# Patient Record
Sex: Male | Born: 1960 | Race: White | Hispanic: No | Marital: Married | Smoking: Current every day smoker
Health system: Southern US, Community
[De-identification: ages and names within clinical notes are randomized; demographics above are authoritative.]

## PROBLEM LIST (undated history)

## (undated) DIAGNOSIS — I10 Essential (primary) hypertension: Secondary | ICD-10-CM

## (undated) DIAGNOSIS — E119 Type 2 diabetes mellitus without complications: Secondary | ICD-10-CM

## (undated) DIAGNOSIS — E7800 Pure hypercholesterolemia, unspecified: Secondary | ICD-10-CM

## (undated) DIAGNOSIS — C3490 Malignant neoplasm of unspecified part of unspecified bronchus or lung: Secondary | ICD-10-CM

## (undated) DIAGNOSIS — K219 Gastro-esophageal reflux disease without esophagitis: Secondary | ICD-10-CM

## (undated) HISTORY — PX: NO PAST SURGERIES: SHX2092

---

## 2004-03-04 ENCOUNTER — Emergency Department (HOSPITAL_COMMUNITY): Admission: EM | Admit: 2004-03-04 | Discharge: 2004-03-04 | Payer: Self-pay | Admitting: Emergency Medicine

## 2009-07-27 ENCOUNTER — Emergency Department (HOSPITAL_COMMUNITY): Admission: EM | Admit: 2009-07-27 | Discharge: 2009-07-27 | Payer: Self-pay | Admitting: Emergency Medicine

## 2013-11-02 ENCOUNTER — Encounter (HOSPITAL_COMMUNITY): Payer: Self-pay | Admitting: Emergency Medicine

## 2013-11-02 ENCOUNTER — Inpatient Hospital Stay (HOSPITAL_COMMUNITY): Payer: Self-pay

## 2013-11-02 ENCOUNTER — Emergency Department (HOSPITAL_COMMUNITY): Payer: Self-pay

## 2013-11-02 ENCOUNTER — Inpatient Hospital Stay (HOSPITAL_COMMUNITY)
Admission: EM | Admit: 2013-11-02 | Discharge: 2013-11-04 | DRG: 683 | Disposition: A | Discharge status: Home / Self Care | Payer: Self-pay | Attending: Internal Medicine | Admitting: Internal Medicine

## 2013-11-02 DIAGNOSIS — F17200 Nicotine dependence, unspecified, uncomplicated: Secondary | ICD-10-CM | POA: Diagnosis present

## 2013-11-02 DIAGNOSIS — E871 Hypo-osmolality and hyponatremia: Secondary | ICD-10-CM

## 2013-11-02 DIAGNOSIS — N179 Acute kidney failure, unspecified: Principal | ICD-10-CM

## 2013-11-02 DIAGNOSIS — M549 Dorsalgia, unspecified: Secondary | ICD-10-CM | POA: Diagnosis present

## 2013-11-02 DIAGNOSIS — I10 Essential (primary) hypertension: Secondary | ICD-10-CM | POA: Diagnosis present

## 2013-11-02 DIAGNOSIS — Z833 Family history of diabetes mellitus: Secondary | ICD-10-CM

## 2013-11-02 DIAGNOSIS — T502X5A Adverse effect of carbonic-anhydrase inhibitors, benzothiadiazides and other diuretics, initial encounter: Secondary | ICD-10-CM | POA: Diagnosis present

## 2013-11-02 DIAGNOSIS — I2699 Other pulmonary embolism without acute cor pulmonale: Secondary | ICD-10-CM

## 2013-11-02 DIAGNOSIS — F1010 Alcohol abuse, uncomplicated: Secondary | ICD-10-CM | POA: Diagnosis present

## 2013-11-02 DIAGNOSIS — T3995XA Adverse effect of unspecified nonopioid analgesic, antipyretic and antirheumatic, initial encounter: Secondary | ICD-10-CM | POA: Diagnosis present

## 2013-11-02 DIAGNOSIS — I959 Hypotension, unspecified: Secondary | ICD-10-CM

## 2013-11-02 DIAGNOSIS — D72829 Elevated white blood cell count, unspecified: Secondary | ICD-10-CM | POA: Diagnosis present

## 2013-11-02 HISTORY — DX: Type 2 diabetes mellitus without complications: E11.9

## 2013-11-02 HISTORY — DX: Essential (primary) hypertension: I10

## 2013-11-02 LAB — I-STAT CHEM 8, ED
BUN: 122 mg/dL — AB (ref 6–23)
CALCIUM ION: 1.22 mmol/L (ref 1.12–1.23)
CREATININE: 4.7 mg/dL — AB (ref 0.50–1.35)
Chloride: 91 mEq/L — ABNORMAL LOW (ref 96–112)
GLUCOSE: 150 mg/dL — AB (ref 70–99)
HEMATOCRIT: 48 % (ref 39.0–52.0)
Hemoglobin: 16.3 g/dL (ref 13.0–17.0)
Potassium: 3.9 mEq/L (ref 3.7–5.3)
SODIUM: 125 meq/L — AB (ref 137–147)
TCO2: 24 mmol/L (ref 0–100)

## 2013-11-02 LAB — D-DIMER, QUANTITATIVE (NOT AT ARMC): D DIMER QUANT: 0.42 ug{FEU}/mL (ref 0.00–0.48)

## 2013-11-02 LAB — URINALYSIS, ROUTINE W REFLEX MICROSCOPIC
BILIRUBIN URINE: NEGATIVE
Glucose, UA: NEGATIVE mg/dL
Hgb urine dipstick: NEGATIVE
KETONES UR: NEGATIVE mg/dL
Leukocytes, UA: NEGATIVE
Nitrite: NEGATIVE
PH: 5.5 (ref 5.0–8.0)
PROTEIN: NEGATIVE mg/dL
Specific Gravity, Urine: 1.02 (ref 1.005–1.030)
Urobilinogen, UA: 0.2 mg/dL (ref 0.0–1.0)

## 2013-11-02 LAB — COMPREHENSIVE METABOLIC PANEL
ALT: 43 U/L (ref 0–53)
AST: 28 U/L (ref 0–37)
Albumin: 3.7 g/dL (ref 3.5–5.2)
Alkaline Phosphatase: 36 U/L — ABNORMAL LOW (ref 39–117)
BUN: 111 mg/dL — AB (ref 6–23)
CALCIUM: 9.9 mg/dL (ref 8.4–10.5)
CO2: 24 mEq/L (ref 19–32)
Chloride: 84 mEq/L — ABNORMAL LOW (ref 96–112)
Creatinine, Ser: 4.65 mg/dL — ABNORMAL HIGH (ref 0.50–1.35)
GFR calc non Af Amer: 13 mL/min — ABNORMAL LOW (ref >90)
GFR, EST AFRICAN AMERICAN: 15 mL/min — AB (ref >90)
GLUCOSE: 158 mg/dL — AB (ref 70–99)
Potassium: 4 mEq/L (ref 3.7–5.3)
SODIUM: 126 meq/L — AB (ref 137–147)
TOTAL PROTEIN: 7.2 g/dL (ref 6.0–8.3)
Total Bilirubin: 0.2 mg/dL — ABNORMAL LOW (ref 0.3–1.2)

## 2013-11-02 LAB — PROTIME-INR
INR: 0.99 (ref 0.00–1.49)
PROTHROMBIN TIME: 12.9 s (ref 11.6–15.2)

## 2013-11-02 LAB — CBC
HCT: 42.7 % (ref 39.0–52.0)
Hemoglobin: 15.5 g/dL (ref 13.0–17.0)
MCH: 37.1 pg — AB (ref 26.0–34.0)
MCHC: 36.3 g/dL — AB (ref 30.0–36.0)
MCV: 102.2 fL — AB (ref 78.0–100.0)
PLATELETS: 287 10*3/uL (ref 150–400)
RBC: 4.18 MIL/uL — ABNORMAL LOW (ref 4.22–5.81)
RDW: 12.5 % (ref 11.5–15.5)
WBC: 13.1 10*3/uL — ABNORMAL HIGH (ref 4.0–10.5)

## 2013-11-02 LAB — MRSA PCR SCREENING: MRSA BY PCR: NEGATIVE

## 2013-11-02 LAB — CREATININE, URINE, RANDOM: Creatinine, Urine: 91.56 mg/dL

## 2013-11-02 LAB — SODIUM, URINE, RANDOM: SODIUM UR: 42 meq/L

## 2013-11-02 LAB — TROPONIN I: Troponin I: <0.3 ng/mL (ref <0.30)

## 2013-11-02 LAB — URIC ACID: Uric Acid, Serum: 12 mg/dL — ABNORMAL HIGH (ref 4.0–7.8)

## 2013-11-02 LAB — APTT: APTT: 29 s (ref 24–37)

## 2013-11-02 MED ORDER — TECHNETIUM TC 99M DIETHYLENETRIAME-PENTAACETIC ACID
40.0000 | Freq: Once | INTRAVENOUS | Status: AC | PRN
  Administered 2013-11-02: 40 via INTRAVENOUS

## 2013-11-02 MED ORDER — ONDANSETRON HCL 4 MG PO TABS: 4.0000 mg | ORAL_TABLET | Freq: Four times a day (QID) | ORAL | Status: DC | PRN

## 2013-11-02 MED ORDER — OXYCODONE HCL 5 MG PO TABS
5.0000 mg | ORAL_TABLET | ORAL | Status: DC | PRN
  Administered 2013-11-02: 5 mg via ORAL
  Filled 2013-11-02: qty 1

## 2013-11-02 MED ORDER — NICOTINE 21 MG/24HR TD PT24
21.0000 mg | MEDICATED_PATCH | Freq: Every day | TRANSDERMAL | Status: DC
  Administered 2013-11-03 – 2013-11-04 (×2): 21 mg via TRANSDERMAL
  Filled 2013-11-02 (×2): qty 1

## 2013-11-02 MED ORDER — MOMETASONE FURO-FORMOTEROL FUM 100-5 MCG/ACT IN AERO
2.0000 | INHALATION_SPRAY | Freq: Two times a day (BID) | RESPIRATORY_TRACT | Status: DC
  Administered 2013-11-03 – 2013-11-04 (×3): 2 via RESPIRATORY_TRACT
  Filled 2013-11-02: qty 8.8

## 2013-11-02 MED ORDER — ACETAMINOPHEN 650 MG RE SUPP: 650.0000 mg | Freq: Four times a day (QID) | RECTAL | Status: DC | PRN

## 2013-11-02 MED ORDER — SODIUM CHLORIDE 0.9 % IV BOLUS (SEPSIS)
1000.0000 mL | Freq: Once | INTRAVENOUS | Status: AC
  Administered 2013-11-02: 1000 mL via INTRAVENOUS

## 2013-11-02 MED ORDER — FLUTICASONE-SALMETEROL 115-21 MCG/ACT IN AERO: 2.0000 | INHALATION_SPRAY | Freq: Two times a day (BID) | RESPIRATORY_TRACT | Status: DC

## 2013-11-02 MED ORDER — SODIUM CHLORIDE 0.9 % IJ SOLN
3.0000 mL | Freq: Two times a day (BID) | INTRAMUSCULAR | Status: DC
  Administered 2013-11-03: 3 mL via INTRAVENOUS

## 2013-11-02 MED ORDER — ACETAMINOPHEN 325 MG PO TABS
650.0000 mg | ORAL_TABLET | Freq: Four times a day (QID) | ORAL | Status: DC | PRN
  Administered 2013-11-02 – 2013-11-03 (×2): 650 mg via ORAL
  Filled 2013-11-02 (×2): qty 2

## 2013-11-02 MED ORDER — HEPARIN (PORCINE) IN NACL 100-0.45 UNIT/ML-% IJ SOLN
12.0000 [IU]/kg/h | INTRAMUSCULAR | Status: DC
  Administered 2013-11-02: 12 [IU]/kg/h via INTRAVENOUS
  Filled 2013-11-02: qty 250

## 2013-11-02 MED ORDER — MOMETASONE FURO-FORMOTEROL FUM 100-5 MCG/ACT IN AERO
INHALATION_SPRAY | RESPIRATORY_TRACT | Status: AC
  Filled 2013-11-02: qty 8.8

## 2013-11-02 MED ORDER — ONDANSETRON HCL 4 MG/2ML IJ SOLN: 4.0000 mg | Freq: Four times a day (QID) | INTRAMUSCULAR | Status: DC | PRN

## 2013-11-02 MED ORDER — HEPARIN BOLUS VIA INFUSION
4000.0000 [IU] | Freq: Once | INTRAVENOUS | Status: AC
  Administered 2013-11-02: 4000 [IU] via INTRAVENOUS

## 2013-11-02 MED ORDER — HEPARIN (PORCINE) IN NACL 100-0.45 UNIT/ML-% IJ SOLN
750.0000 [IU]/h | INTRAMUSCULAR | Status: DC
  Administered 2013-11-02: 750 [IU]/h via INTRAVENOUS

## 2013-11-02 MED ORDER — SODIUM CHLORIDE 0.9 % IV SOLN
INTRAVENOUS | Status: DC
  Administered 2013-11-02 – 2013-11-04 (×5): via INTRAVENOUS

## 2013-11-02 MED ORDER — SODIUM CHLORIDE 0.9 % IV SOLN
INTRAVENOUS | Status: AC
  Administered 2013-11-02: 100 mL/h via INTRAVENOUS

## 2013-11-02 MED ORDER — ONDANSETRON HCL 4 MG/2ML IJ SOLN: 4.0000 mg | Freq: Three times a day (TID) | INTRAMUSCULAR | Status: AC | PRN

## 2013-11-02 MED ORDER — TECHNETIUM TO 99M ALBUMIN AGGREGATED
6.0000 | Freq: Once | INTRAVENOUS | Status: AC | PRN
  Administered 2013-11-02: 6 via INTRAVENOUS

## 2013-11-02 NOTE — ED Provider Notes (Signed)
CSN: [id number]     Arrival date & time 11/02/13  1226 History   This chart was scribed for Dakota Acosta, MD by Dakota Patterson, ED Scribe. This patient was seen in room APA10/APA10 and the patient's care was started at 1:16 PM.   Chief Complaint  Patient presents with  . Hypotension  . Dizziness     The history is provided by the patient. No language interpreter was used.    HPI Comments: Dakota Patterson is a 53 y.o. male who presents to the Emergency Department complaining of persistent hypotension over the past few days. He states that he began treatment for HTN 3 months ago. Baseline prior to the medications was 200/100s. He is on 25 mg Metoprolol and Lisinopril with HTCZ at the same time. He reports that he stopped drinking alcohol 6 days ago after consuming alcohol daily before that. He states that he then developed nausea and emesis shortly after his last alcoholic drink that has since resolved. He reports a gradual onset, gradually worsening lower back pain that radiates into bilateral neck and shoulders and is described as stabbing over the past few days. 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 He reports that he became concerned when he began to have difficulty laying down and sleeping at night due to the pain. He states that the pain is non-exertional, non-positional and not associated with deep breathing. He also reports that he has been feeling lightheaded over the past few days and reports one episode of "swimmyheadedness" today but denies any lightheadedness currently. He states that he has experienced a decreased appetite as well having to "choke down" food. Per wife, pt's BPs have been 78/57, 59/45, 59/43, 75/56, 72/52, 71/48, 63/49, 70/50, 68/54 within the past few days and pt states that he stopped taking the Metoprolol 3 days ago due to the hypotension. He denies any CP, SOB, extremity numbness or  weakness, fevers, chills, nausea, emesis and leg swelling as associated symptoms. He has a decreased kidney function at baseline and denies any changes in urination. Urine has been clear but in small amounts which is usual for him. He is a current everyday smoker  PC[facility] with [facility]  Past Medical History  Diagnosis Date  . Hypertension    History reviewed. No pertinent past surgical history. Family History  Problem Relation Age of Onset  . Diabetes Mother    History  Substance Use Topics  . Smoking status: Current Every Day Smoker -- 1.50 packs/day for 40 years    Types: Cigarettes  . Smokeless tobacco: Never Used  . Alcohol Use: 29.4 oz/week    49 Shots of liquor per week     Comment: Per patient hasn't drink any alcohol in a week    Review of Systems  A complete 10 system review of systems was obtained and all systems are negative except as noted in the HPI and PMH.    Allergies  Review of patient's allergies indicates no known allergies.  Home Medications   Prior to Admission medications   Not on File   Triage Vitals: BP 88/59  Pulse 89  Temp(Src) 97.3 F (36.3 C) (Oral)  Resp 18  Ht 5\' 3"  (1.6 m)  Wt 135 lb (61.236 kg)  BMI 23.92 kg/m2  SpO2 99%  Physical Exam  Nursing note and vitals reviewed. Constitutional: He is oriented to person, place, and time. He  appears well-developed and well-nourished. No distress.  HENT:  Head: Normocephalic and atraumatic.  Eyes: EOM are normal.  Neck: Neck supple. No tracheal deviation present.  Cardiovascular: Normal rate.   Pulmonary/Chest: Effort normal. No respiratory distress.  Abdominal: Soft. He exhibits no distension. There is no tenderness.  Musculoskeletal: Normal range of motion.  Neurological: He is alert and oriented to person, place, and time.  Moves all extremities x4, speech is clear, movements are coordinated, the patient is generally weak.  Skin: Skin is warm and dry.  Psychiatric: He has  a normal mood and affect. His behavior is normal.    ED Course  Procedures (including critical care time)  Medications  heparin bolus via infusion 4,000 Units (not administered)  heparin ADULT infusion 100 units/mL (25000 units/250 mL) (not administered)  sodium chloride 0.9 % bolus 1,000 mL (1,000 mLs Intravenous New Bag/Given 11/02/13 1340)  sodium chloride 0.9 % bolus 1,000 mL (1,000 mLs Intravenous New Bag/Given 11/02/13 1340)    DIAGNOSTIC STUDIES: Oxygen Saturation is 99% on RA, normal by my interpretation.    COORDINATION OF CARE: 1:21 PM-Discussed treatment plan which includes IV fluids, CXR, CBC panel, CMP and UA with pt at bedside and pt agreed to plan.   2:04 PM- Radiologist recommend MRA of the chest due to renal failure. CT has been cancelled.   Labs Review Labs Reviewed  CBC - Abnormal; Notable for the following:    WBC 13.1 (*)    RBC 4.18 (*)    MCV 102.2 (*)    MCH 37.1 (*)    MCHC 36.3 (*)    All other components within normal limits  COMPREHENSIVE METABOLIC PANEL - Abnormal; Notable for the following:    Sodium 126 (*)    Chloride 84 (*)    Glucose, Bld 158 (*)    BUN 111 (*)    Creatinine, Ser 4.65 (*)    Alkaline Phosphatase 36 (*)    Total Bilirubin 0.2 (*)    GFR calc non Af Amer 13 (*)    GFR calc Af Amer 15 (*)    All other components within normal limits  I-STAT CHEM 8, ED - Abnormal; Notable for the following:    Sodium 125 (*)    Chloride 91 (*)    BUN 122 (*)    Creatinine, Ser 4.70 (*)    Glucose, Bld 150 (*)    All other components within normal limits  TROPONIN I  URINALYSIS, ROUTINE W REFLEX MICROSCOPIC  APTT  PROTIME-INR    Imaging Review Mr Jodene Nam Chest Wo Contrast  11/02/2013   CLINICAL DATA:  History of hypertension, now with back pain radiating to the neck. Patient with elevated creatinine and does cannot receive intravenous contrast. Please evaluate for thoracic aortic aneurysm and/or dissection.  EXAM: MRA CHEST WITHOUT CONTRAST   TECHNIQUE: Angiographic images of the chest were obtained using MRA technique without intravenous contrast.  CONTRAST:  None  COMPARISON:  DG CHEST 1V PORT dated 11/02/2013  FINDINGS: Vascular Findings:  The thoracic aorta is of normal caliber with measurements as follows. No definite thoracic aortic dissection or periaortic stranding on this noncontrast examination.  Bovine configuration of the aortic arch is suspected. The branch vessels of the aortic arch widely patent throughout their imaged course.  Normal heart size. No pericardial effusion. Although this examination was not tailored for the evaluation the pulmonary arteries, there are discrete filling defect in the central pulmonary arterial tree to suggest central pulmonary embolism. Normal caliber of  the main pulmonary artery.  -------------------------------------------------------------  Thoracic aortic measurements:  Sinotubular junction  35 mm as measured in greatest oblique coronal dimension.  Proximal ascending aorta  27 mm as measured in greatest oblique axial dimension at the level of the main pulmonary artery and approximately 28 mm in greatest oblique sagittal dimension (image 13, series 12).  Aortic arch aorta  20 mm as measured in greatest oblique sagittal dimension.  Proximal descending thoracic aorta  20 mm as measured in greatest oblique axial dimension at the level of the main pulmonary artery.  Distal descending thoracic aorta  20 mm as measured in greatest oblique axial dimension at the level of the diaphragmatic hiatus.  Review of the MIP images confirms the above findings.  -------------------------------------------------------------  Non-Vascular Findings:  No focal airspace opacities.  No pleural effusion.  No bulky mediastinal or hilar lymphadenopathy on this noncontrast examination.  IMPRESSION: No acute cardiopulmonary disease on this noncontrast examination. Specifically, no evidence of thoracic aortic aneurysm or dissection.    Electronically Signed   By: Sandi Mariscal M.D.   On: 11/02/2013 15:34   Dg Chest Port 1 View  11/02/2013   CLINICAL DATA:  Chest pain.  EXAM: PORTABLE CHEST - 1 VIEW  COMPARISON:  None.  FINDINGS: Lungs are clear. Heart size is normal. No pneumothorax or pleural effusion. No focal bony abnormality.  IMPRESSION: Negative chest.   Electronically Signed   By: Inge Rise M.D.   On: 11/02/2013 14:12     EKG Interpretation   Date/Time:  Thursday Nov 02 2013 12:53:11 EDT Ventricular Rate:  85 PR Interval:  128 QRS Duration: 110 QT Interval:  376 QTC Calculation: 447 R Axis:   86 Text Interpretation:  Sinus rhythm LAE, consider biatrial enlargement Left  ventricular hypertrophy Nonspecific T abnrm, anterolateral leads Abnormal  ekg No old tracing to compare Confirmed by Chisom Aust  MD, [place] ([id number]) on  11/02/2013 2:03:54 PM Also confirmed by Lacinda Axon  MD, [place] ([id number])  on 11/02/2013  2:55:31 PM      MDM   Final diagnoses:  Pulmonary embolism  Hypotension  Hyponatremia  Acute renal failure     Discussed care with doctors office RN - verbal lab results from most recent measurement  1.66 - Cr of 10/20/12 2.2 on 4/17 1.19 In March  Last Na - 129  MRI of the chest shows no obvious aortic dissection I does possibly confirm a central pulmonary embolism.  Will start heparin bolus with drip, consult with the hospitalist, continuing to follow blood pressure and pulse, labs show renal failure and hyponatremia worse today than in the past, definitely will need inpatient admission. Possibly ACE inhibitor and dehydration related. Will also need CT scan or lower extremity Dopplers to confirm presence or absence of pulmonary embolism. The patient has remained persistently mildly hypotensive and is requiring ongoing fluid boluses. The patient is critically ill.  Discussed with the hospitalist who agreed to place the patient in a step down bed.  CRITICAL CARE Performed by: Dakota Patterson Total critical  care time: 59 Critical care time was exclusive of separately billable procedures and treating other patients. Critical care was necessary to treat or prevent imminent or life-threatening deterioration. Critical care was time spent personally by me on the following activities: development of treatment plan with patient and/or surrogate as well as nursing, discussions with consultants, evaluation of patient's response to treatment, examination of patient, obtaining history from patient or surrogate, ordering and performing treatments and interventions, ordering and review  of laboratory studies, ordering and review of radiographic studies, pulse oximetry and re-evaluation of patient's condition.    I personally performed the services described in this documentation, which was scribed in my presence. The recorded information has been reviewed and is accurate.    Dakota Acosta, MD 11/02/13 [phone]

## 2013-11-02 NOTE — ED Notes (Signed)
Patient sent here by [facility].  Patient reports hypotension with dizziness x3-4 days. Per wife blood pressures include 78/57, 59/45, 59/43, 75/56, 72/52, 71/48, 63/49, 70/50, 68/54. Patient reports still taking blood pressure medication despite hypotension. Reports generalized fatigue and lower back pain. Denies any fevers.

## 2013-11-02 NOTE — ED Notes (Signed)
Family reported that [facility] would send some records on Pt. Had not received, so I called and personnel reported that they didn"t know anything about sending paperwork.

## 2013-11-02 NOTE — ED Notes (Signed)
C/ back pain x 3 days that radiates to shoulders. In addition, pt has HTN so wife was checking BP at home and BP was noted to be low 81-59'E systolic and 70-76 diastolic so they contacted his PCP at [facility] and his MD suggested he should come here. Currently, BP 93/58.

## 2013-11-02 NOTE — Care Management Note (Signed)
Patient was noted to not have a PCP listed, but per patient PCP is [place]. Entered this information into computer.

## 2013-11-02 NOTE — H&P (Signed)
[facility] History and Physical  Dakota Patterson XNA:[id number] DOB: 1961-04-28 DOA: 11/02/2013  Referring physician: Dr Sabra Heck PCP: No PCP Per Patient   Chief Complaint:  Mid back pain x 7 days Low BP x 3 days  HPI:  53 y/o male with hx of etoh abuse, tobacco abuse who has been on antihypertensives for past 3 months for uncontrolled blood pressure with readings of 200s/ 100s mmhg was brought to the hospital for episodes of low blood pressure with dizziness for past 3 days. Patient reports that for past 10 days she has been having mid back pain with spasms radiating to the neck. 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 Patient has been on 25 mg of metoprolol along with lisinopril and HCTZ for past 3 months for his blood pressure. He reports taking 600-800 mg of ibuprofen twice daily for up to 2-3 days for the back pain. About one week back  he had vital gastroenteritis with several episodes of nausea and vomiting and watery diarrhea that lasted for 3 days . He was unable to keep anything down at that time . He recovered  from the symptoms but since then has not been able to eat or drink well and also has not been urinating much . He reports taking few more tablets of ibuprofen last 2 days for the back pain.  For past week he has not been urinating much. His wife recorded his blood pressure for past 3 days and has been reading 78/57, 59/45, 59/43, 75/56, 72/52, 71/48, 63/49, 70/50, 68/54 . Patient has been feeling lightheaded with poor appetite for the same duration and has not taken his blood pressure medications since then. He denies any headache, chest pain, shortness of breath, palpitations, numbness of his extremities, weakness, fevers, chills, leg swellings or further nausea, vomiting or diarrhea. Denies any sick contacts. Denies any exposure to organophosphorus or arsenic compounds. Denies recent travel.  Course in the ED  patient was hypotensive to  722/44 mmhg , his heart rate, respiratory rate and O2 sats were normal. He was afebrile. Patient was given 2 L of IV normal saline bolus after which blood pressure improved to 118/70 mmhg. blood work done and showed leukocytosis with WBC of 13.1, normal hemoglobin/hematocrit and 2 kids. His sodium was 126, chloride of 84. His BUN was 111 creatinine 4.65. subsequent blood work showed sodium of 125, BUN of 122 and creatinine 4.7. Blood glucose was 158. Troponin was negative. EKG was unremarkable except for LVH. Chest x-ray was normal. Given hypotension and back pain patient had an MRA of the chest without contrast to rule out for aortic dissection which was negative showed discrete filling defect in the central pulmonary arterial tree suggesting central pulmonary embolism. Patient was started on IV heparin drip and [facility] called for admission to step down.  Review of Systems:  Constitutional: Denies fever, chills, diaphoresis, appetite change and fatigue.  HEENT: Denies photophobia, eye pain, redness, hearing loss, ear pain, congestion, sore throat, rhinorrhea, sneezing, mouth sores, trouble swallowing, , neck stiffness and tinnitus.   neck pain+ Respiratory: Denies SOB, DOE, cough, chest tightness,  and wheezing.   Cardiovascular: Denies chest pain, palpitations and leg swelling.  Gastrointestinal: Denies nausea, vomiting, abdominal pain, diarrhea, constipation, blood in stool and abdominal distention.  Genitourinary: Poor urine output+ Denies dysuria, urgency, frequency, hematuria, flank pain  Endocrine: Denies: hot or cold intolerance,  polyuria, polydipsia. Musculoskeletal: Mid back pain Denies myalgias,joint swelling, arthralgias and gait  problem.  Skin: Denies pallor, rash and wound.  Neurological:  dizziness and lightheadedness, denies seizures, syncope, weakness, , numbness and headaches.  Psychiatric/Behavioral: Denies confusion,    Past Medical History  Diagnosis Date  .  Hypertension    History reviewed. No pertinent past surgical history. Social History:  reports that he has been smoking Cigarettes.  He has a 60 pack-year smoking history. He has never used smokeless tobacco. He reports that he drinks about 29.4 ounces of alcohol per week. He reports that he uses illicit drugs (Marijuana).  No Known Allergies  Family History  Problem Relation Age of Onset  . Diabetes Mother     Prior to Admission medications   Medication Sig Start Date End Date Taking? Authorizing Provider  lisinopril-hydrochlorothiazide (PRINZIDE,ZESTORETIC) 20-12.5 MG per tablet Take 1 tablet by mouth daily.   Yes Historical Provider, MD  Fluticasone-Salmeterol (ADVAIR HFA IN) Inhale 1 puff into the lungs daily as needed (shortness of breath).    Historical Provider, MD  metoprolol tartrate (LOPRESSOR) 25 MG tablet Take 25 mg by mouth daily.    Historical Provider, MD     Physical Exam:  Filed Vitals:   11/02/13 1415 11/02/13 1430 11/02/13 1628 11/02/13 1630  BP:  75/50 101/65 80/60  Pulse:  47 91 90  Temp:      TempSrc:      Resp: 18 11 17 14   Height:      Weight:      SpO2:  73%  97%    Constitutional: Vital signs reviewed.  Middle aged male in no acute distress  HEENT: no pallor, no icterus, dry oral mucosa, no cervical lymphadenopathy Cardiovascular: RRR, S1 normal, S2 normal, no MRG Chest: CTAB, no wheezes, rales, or rhonchi Abdominal: Soft. Non-tender, non-distended, bowel sounds are normal, no masses, organomegaly, or guarding present.  GU: no CVA tenderness Ext: warm, no edema. Tenderness to palpation over mid back mainly the  mid scapular area bilaterally. No calf swelling or tenderness  Neurological: A&O x3, non focal  Labs on Admission:  Basic Metabolic Panel:  Recent Labs Lab 11/02/13 1334 11/02/13 1353  NA 126* 125*  K 4.0 3.9  CL 84* 91*  CO2 24  --   GLUCOSE 158* 150*  BUN 111* 122*  CREATININE 4.65* 4.70*  CALCIUM 9.9  --    Liver Function  Tests:  Recent Labs Lab 11/02/13 1334  AST 28  ALT 43  ALKPHOS 36*  BILITOT 0.2*  PROT 7.2  ALBUMIN 3.7   No results found for this basename: LIPASE, AMYLASE,  in the last 168 hours No results found for this basename: AMMONIA,  in the last 168 hours CBC:  Recent Labs Lab 11/02/13 1334 11/02/13 1353  WBC 13.1*  --   HGB 15.5 16.3  HCT 42.7 48.0  MCV 102.2*  --   PLT 287  --    Cardiac Enzymes:  Recent Labs Lab 11/02/13 1334  TROPONINI <0.30   BNP: No components found with this basename: POCBNP,  CBG: No results found for this basename: GLUCAP,  in the last 168 hours  Radiological Exams on Admission: Mr Jodene Nam Chest Wo Contrast  11/02/2013   CLINICAL DATA:  History of hypertension, now with back pain radiating to the neck. Patient with elevated creatinine and does cannot receive intravenous contrast. Please evaluate for thoracic aortic aneurysm and/or dissection.  EXAM: MRA CHEST WITHOUT CONTRAST  TECHNIQUE: Angiographic images of the chest were obtained using MRA technique without intravenous contrast.  CONTRAST:  None  COMPARISON:  DG CHEST 1V PORT dated 11/02/2013  FINDINGS: Vascular Findings:  The thoracic aorta is of normal caliber with measurements as follows. No definite thoracic aortic dissection or periaortic stranding on this noncontrast examination.  Bovine configuration of the aortic arch is suspected. The branch vessels of the aortic arch widely patent throughout their imaged course.  Normal heart size. No pericardial effusion. Although this examination was not tailored for the evaluation the pulmonary arteries, there are discrete filling defect in the central pulmonary arterial tree to suggest central pulmonary embolism. Normal caliber of the main pulmonary artery.  -------------------------------------------------------------  Thoracic aortic measurements:  Sinotubular junction  35 mm as measured in greatest oblique coronal dimension.  Proximal ascending aorta  27 mm as  measured in greatest oblique axial dimension at the level of the main pulmonary artery and approximately 28 mm in greatest oblique sagittal dimension (image 13, series 12).  Aortic arch aorta  20 mm as measured in greatest oblique sagittal dimension.  Proximal descending thoracic aorta  20 mm as measured in greatest oblique axial dimension at the level of the main pulmonary artery.  Distal descending thoracic aorta  20 mm as measured in greatest oblique axial dimension at the level of the diaphragmatic hiatus.  Review of the MIP images confirms the above findings.  -------------------------------------------------------------  Non-Vascular Findings:  No focal airspace opacities.  No pleural effusion.  No bulky mediastinal or hilar lymphadenopathy on this noncontrast examination.  IMPRESSION: No acute cardiopulmonary disease on this noncontrast examination. Specifically, no evidence of thoracic aortic aneurysm or dissection.   Electronically Signed   By: Sandi Mariscal M.D.   On: 11/02/2013 15:34   Dg Chest Port 1 View  11/02/2013   CLINICAL DATA:  Chest pain.  EXAM: PORTABLE CHEST - 1 VIEW  COMPARISON:  None.  FINDINGS: Lungs are clear. Heart size is normal. No pneumothorax or pleural effusion. No focal bony abnormality.  IMPRESSION: Negative chest.   Electronically Signed   By: Inge Rise M.D.   On: 11/02/2013 14:12    EKG: NSR@85  with LVH, no ST-T changes  Assessment/Plan  Principal Problem:   Hypotension Admit to step down likely a combination of antihypertensives, recent viral gastroenteritis, and poor by mouth intake. -Patient given 2 L IV normal saline bolus in the ED following which his blood pressure has improved to 118/70 mmhg.  I will place him on IV normal saline at 150 cc per hour and monitor. Avoid blood pressure medication at this time.   Active Problems:   ? Pulmonary embolism As suggested on MRI of his chest. Patient denies any shortness of breath or chest pain. Denies any  recent travel or family history of blood clots. He has been started on IV heparin drip. Will check VQ scan to confirm PE and Doppler of his lower extremity to rule out DVT. Check d-dimer.     Acute kidney injury Combination of prerenal with recent gastroenteritis and poor by mouth intake, HCTZ and ACE inhibitor and intake of NSAIDs. Check urine on some and urine lites. UA unremarkable. Monitor strict I/O. we'll hydrate him aggressively with IV normal saline at 150 cc per hour. -Wife reports that he is creatinine about 2 weeks back had worsened to 2.1 when checked as outpatient. -hold blood pressure medications at this time. Check renal ultrasound. Check uric acid level and TSH -Monitor renal function in a.m. with IV fluids  Upper back pain Appears to be musculoskeletal with muscle spasms radiating to the  neck. MRI of the chest was unremarkable. We'll place him on when necessary oxycodone and add Flexeril. Does not have any upper extremity weakness or numbness. If pain persistent may need MRI of the cervical/ thoracic spine.    Hyponatremia Secondary to dehydration and heavy etoh use. Wife reports his sodium is chronically low.  Alcohol abuse Patient reports his last drink to be one week back. Does not have any signs of withdrawal.  Will monitor for now.  Tobacco abuse Smokes 1 and 1/2  PPD . Reports trying Chantix in the past but made him sick. Place him on nicotine patch. Counseled strongly on cessation.  Diet:regular  DVT prophylaxis: IV heparin   Code Status: full code Family Communication: discussed with wife and mother in law  at bedside Disposition Plan: home once stable  Dylann Gallier [facility] Pager [phone]  Total time spent on admission :70 minutes  If 7PM-7AM, please contact night-coverage [email or web address] Password [email or web address] 11/02/2013, 5:08 PM

## 2013-11-03 ENCOUNTER — Inpatient Hospital Stay (HOSPITAL_COMMUNITY): Payer: Self-pay

## 2013-11-03 DIAGNOSIS — F1010 Alcohol abuse, uncomplicated: Secondary | ICD-10-CM

## 2013-11-03 DIAGNOSIS — F17200 Nicotine dependence, unspecified, uncomplicated: Secondary | ICD-10-CM

## 2013-11-03 LAB — OSMOLALITY: Osmolality: 302 mOsm/kg — ABNORMAL HIGH (ref 275–300)

## 2013-11-03 LAB — BASIC METABOLIC PANEL
BUN: 76 mg/dL — ABNORMAL HIGH (ref 6–23)
CHLORIDE: 102 meq/L (ref 96–112)
CO2: 23 meq/L (ref 19–32)
Calcium: 9 mg/dL (ref 8.4–10.5)
Creatinine, Ser: 1.88 mg/dL — ABNORMAL HIGH (ref 0.50–1.35)
GFR calc Af Amer: 45 mL/min — ABNORMAL LOW (ref >90)
GFR, EST NON AFRICAN AMERICAN: 39 mL/min — AB (ref >90)
GLUCOSE: 107 mg/dL — AB (ref 70–99)
POTASSIUM: 4.5 meq/L (ref 3.7–5.3)
Sodium: 135 mEq/L — ABNORMAL LOW (ref 137–147)

## 2013-11-03 LAB — OSMOLALITY, URINE: OSMOLALITY UR: 287 mosm/kg — AB (ref 390–1090)

## 2013-11-03 MED ORDER — VITAMIN B-1 100 MG PO TABS
100.0000 mg | ORAL_TABLET | Freq: Every day | ORAL | Status: DC
  Administered 2013-11-03 – 2013-11-04 (×2): 100 mg via ORAL
  Filled 2013-11-03 (×2): qty 1

## 2013-11-03 MED ORDER — FOLIC ACID 1 MG PO TABS
1.0000 mg | ORAL_TABLET | Freq: Every day | ORAL | Status: DC
  Administered 2013-11-03 – 2013-11-04 (×2): 1 mg via ORAL
  Filled 2013-11-03 (×2): qty 1

## 2013-11-03 MED ORDER — THIAMINE HCL 100 MG/ML IJ SOLN: 100.0000 mg | Freq: Every day | INTRAMUSCULAR | Status: DC

## 2013-11-03 MED ORDER — CYCLOBENZAPRINE HCL 10 MG PO TABS
5.0000 mg | ORAL_TABLET | Freq: Two times a day (BID) | ORAL | Status: DC | PRN
  Administered 2013-11-03: 5 mg via ORAL
  Filled 2013-11-03: qty 1

## 2013-11-03 MED ORDER — ADULT MULTIVITAMIN W/MINERALS CH
1.0000 | ORAL_TABLET | Freq: Every day | ORAL | Status: DC
  Administered 2013-11-03 – 2013-11-04 (×2): 1 via ORAL
  Filled 2013-11-03 (×2): qty 1

## 2013-11-03 MED ORDER — LORAZEPAM 1 MG PO TABS: 1.0000 mg | ORAL_TABLET | Freq: Four times a day (QID) | ORAL | Status: DC | PRN

## 2013-11-03 MED ORDER — LORAZEPAM 2 MG/ML IJ SOLN
1.0000 mg | Freq: Four times a day (QID) | INTRAMUSCULAR | Status: DC | PRN
Start: 2013-11-03 — End: 2013-11-04

## 2013-11-03 MED ORDER — HEPARIN SODIUM (PORCINE) 5000 UNIT/ML IJ SOLN
5000.0000 [IU] | Freq: Three times a day (TID) | INTRAMUSCULAR | Status: DC
  Administered 2013-11-03 – 2013-11-04 (×3): 5000 [IU] via SUBCUTANEOUS
  Filled 2013-11-03 (×3): qty 1

## 2013-11-03 NOTE — Progress Notes (Signed)
PT TRANSFERRING TO ROOM 340. PT ALERT AND ORIENTED. NO C/O PAIN THIS SHIFT. LT FOREARM IV INFUSING W/O DIFFICULTY. HR 78 IN NSR. BP STABLE. TRANSFER REPORT CALLED TO MATTHEW RN ON 300.

## 2013-11-03 NOTE — Care Management Note (Signed)
    Page 1 of 1   11/03/2013     3:15:15 PM CARE MANAGEMENT NOTE 11/03/2013  Patient:  Dakota Patterson, Dakota Patterson   Account Number:  [id number]  Date Initiated:  11/03/2013  Documentation initiated by:  Theophilus Kinds  Subjective/Objective Assessment:   Pt admitted from home with hypotension. Pt lives with his wife and is very independent. Pt will return home at discharge.     Action/Plan:   Pt has PCP at [facility]. No other CM needs noted.   Anticipated DC Date:  11/05/2013   Anticipated DC Plan:  [place]  CM consult      Choice offered to / List presented to:             Status of service:  Completed, signed off Medicare Important Message given?   (If response is "NO", the following Medicare IM given date fields will be blank) Date Medicare IM given:   Date Additional Medicare IM given:    Discharge Disposition:  HOME/SELF CARE  Per UR Regulation:    If discussed at Long Length of Stay Meetings, dates discussed:    Comments:  11/03/13 [place], RN BSN CM

## 2013-11-03 NOTE — Progress Notes (Signed)
[facility] PROGRESS NOTE  Dakota Patterson MVH:[id number] DOB: May 17, 1961 DOA: 11/02/2013 PCP: No PCP Per Patient  Assessment/Plan: 1-Hypotension: due to dehydration and continue use of antihypertensive agents. -responded to IVF's resuscitation and stopping antihypertensive drugs -will continue holding BP meds for now -adjust IVF's rate -encourage patient to increase PO intake  2-Acute renal failure: multifactorial: secondary to hypotension, NSAIDS and conitnue use of ACE/HCTZ -continue IVF's -UA WNL and no suggesting UTI -continue holding HCTZ/ACE and NSAID's -follow renal function trend -Cr down to 1.88 today  3-tobacco abuse/alcohol abuse: cessation counseling provided.  -will start CIWA -continue nicotine patch.  4-HTN: stable w/o meds currently -will restart if needed adjusted dose of metoprolol -low sodium diet and close follow up with PCP  5-hyponatremia and elevated MCV: most likely related to alcohol. Also low sodium due to dehydration and use of HCTZ. -will check B12 level and folate  -follow electrolytes trend -continue IVF's -encourage to quit drinking  6-back pain: appears to be muscular in nature  -continue use of PRN oxycodone and flexeril  DVT: heparin   Code Status: Full Family Communication: wife at bedside Disposition Plan: transfer to med-surg bed; home in am if continue stable.   Consultants:  None   Procedures:  See below for x-ray reports   Antibiotics:  None   HPI/Subjective: NAD, afebrile. Patient complaining of mild back pain. Otherwise no complaints.  Objective: Filed Vitals:   11/03/13 0900  BP: 123/72  Pulse: 94  Temp:   Resp: 19    Intake/Output Summary (Last 24 hours) at 11/03/13 1028 Last data filed at 11/03/13 0900  Gross per 24 hour  Intake 2115.83 ml  Output   2775 ml  Net -659.17 ml   Filed Weights   11/02/13 1242 11/02/13 1704 11/03/13 0500  Weight: 61.236 kg (135 lb) 50.9 kg (112 lb 3.4 oz) 50.9 kg  (112 lb 3.4 oz)    Exam:   General:  NAD, afebrile, feeling better; complaining of just mild back pain  Cardiovascular: S1 and S2,2 no rub so rgallops  Respiratory: CTA bilaterally  Abdomen: soft, NT, ND, positive BS  Musculoskeletal: slight mid/low back pain, no numbness, no decrease strength on his extremities.   Data Reviewed: Basic Metabolic Panel:  Recent Labs Lab 11/02/13 1334 11/02/13 1353 11/03/13 0809  NA 126* 125* 135*  K 4.0 3.9 4.5  CL 84* 91* 102  CO2 24  --  23  GLUCOSE 158* 150* 107*  BUN 111* 122* 76*  CREATININE 4.65* 4.70* 1.88*  CALCIUM 9.9  --  9.0   Liver Function Tests:  Recent Labs Lab 11/02/13 1334  AST 28  ALT 43  ALKPHOS 36*  BILITOT 0.2*  PROT 7.2  ALBUMIN 3.7   CBC:  Recent Labs Lab 11/02/13 1334 11/02/13 1353  WBC 13.1*  --   HGB 15.5 16.3  HCT 42.7 48.0  MCV 102.2*  --   PLT 287  --    Cardiac Enzymes:  Recent Labs Lab 11/02/13 1334  TROPONINI <0.30    Recent Results (from the past 240 hour(s))  MRSA PCR SCREENING     Status: None   Collection Time    11/02/13  5:34 PM      Result Value Ref Range Status   MRSA by PCR NEGATIVE  NEGATIVE Final   Comment:            The GeneXpert MRSA Assay (FDA     approved for NASAL specimens     only), is one  component of a     comprehensive MRSA colonization     surveillance program. It is not     intended to diagnose MRSA     infection nor to guide or     monitor treatment for     MRSA infections.     Studies: Mr Jodene Nam Chest Wo Contrast  11/02/2013   CLINICAL DATA:  History of hypertension, now with back pain radiating to the neck. Patient with elevated creatinine and does cannot receive intravenous contrast. Please evaluate for thoracic aortic aneurysm and/or dissection.  EXAM: MRA CHEST WITHOUT CONTRAST  TECHNIQUE: Angiographic images of the chest were obtained using MRA technique without intravenous contrast.  CONTRAST:  None  COMPARISON:  DG CHEST 1V PORT dated  11/02/2013  FINDINGS: Vascular Findings:  The thoracic aorta is of normal caliber with measurements as follows. No definite thoracic aortic dissection or periaortic stranding on this noncontrast examination.  Bovine configuration of the aortic arch is suspected. The branch vessels of the aortic arch widely patent throughout their imaged course.  Normal heart size. No pericardial effusion. Although this examination was not tailored for the evaluation the pulmonary arteries, there are discrete filling defect in the central pulmonary arterial tree to suggest central pulmonary embolism. Normal caliber of the main pulmonary artery.  -------------------------------------------------------------  Thoracic aortic measurements:  Sinotubular junction  35 mm as measured in greatest oblique coronal dimension.  Proximal ascending aorta  27 mm as measured in greatest oblique axial dimension at the level of the main pulmonary artery and approximately 28 mm in greatest oblique sagittal dimension (image 13, series 12).  Aortic arch aorta  20 mm as measured in greatest oblique sagittal dimension.  Proximal descending thoracic aorta  20 mm as measured in greatest oblique axial dimension at the level of the main pulmonary artery.  Distal descending thoracic aorta  20 mm as measured in greatest oblique axial dimension at the level of the diaphragmatic hiatus.  Review of the MIP images confirms the above findings.  -------------------------------------------------------------  Non-Vascular Findings:  No focal airspace opacities.  No pleural effusion.  No bulky mediastinal or hilar lymphadenopathy on this noncontrast examination.  IMPRESSION: No acute cardiopulmonary disease on this noncontrast examination. Specifically, no evidence of thoracic aortic aneurysm or dissection.   Electronically Signed   By: Sandi Mariscal M.D.   On: 11/02/2013 15:34   US Renal  11/03/2013   CLINICAL DATA:  Hypertension and diabetes.  Acute tubular necrosis   EXAM: RENAL/URINARY TRACT ULTRASOUND COMPLETE  COMPARISON:  None.  FINDINGS: Right Kidney:  Length: 10.2 cm. Echogenicity within normal limits. No mass or hydronephrosis visualized.  Left Kidney:  Length: 10.2 cm. Echogenicity within normal limits. No mass or hydronephrosis visualized. Echogenic structure within the inferior pole of the left kidney may represent a small stone.  Bladder:  Appears normal for degree of bladder distention.  IMPRESSION: 1. Normal exam.  No hydronephrosis.   Electronically Signed   By: Kerby Moors M.D.   On: 11/03/2013 09:02   Nm Pulmonary Perf And Vent  11/02/2013   CLINICAL DATA:  Rule out pulmonary embolus.  EXAM: NUCLEAR MEDICINE VENTILATION - PERFUSION LUNG SCAN  TECHNIQUE: Ventilation images were obtained in multiple projections using inhaled aerosol technetium 99 M DTPA. Perfusion images were obtained in multiple projections after intravenous injection of Tc-13m MAA.  RADIOPHARMACEUTICALS:  40 mCi Tc-40m DTPA aerosol and 6.0 mCi Tc-2m MAA  COMPARISON:  Chest x-ray 11/02/2013  FINDINGS: Ventilation: There is minimal hypoventilation at the  posterior lung bases.  Perfusion: There is minimal hypoperfusion at the posterior lung bases, matched with the ventilation defect. There is no associated chest x-ray finding. Findings are consistent with minimal atelectasis. No suspicious wedge-shaped pleural-based defects are identified.  IMPRESSION: Low probability for clinically significant pulmonary embolus.   Electronically Signed   By: Shon Hale M.D.   On: 11/02/2013 21:01   US Venous Img Lower Bilateral  11/02/2013   CLINICAL DATA:  Pulmonary embolism.  EXAM: BILATERAL LOWER EXTREMITY VENOUS DOPPLER ULTRASOUND  TECHNIQUE: Gray-scale sonography with graded compression, as well as color Doppler and duplex ultrasound were performed to evaluate the lower extremity deep venous systems from the level of the common femoral vein and including the common femoral, femoral, profunda femoral,  popliteal and calf veins including the posterior tibial, peroneal and gastrocnemius veins when visible. The superficial great saphenous vein was also interrogated. Spectral Doppler was utilized to evaluate flow at rest and with distal augmentation maneuvers in the common femoral, femoral and popliteal veins.  COMPARISON:  None.  FINDINGS: RIGHT LOWER EXTREMITY  Common Femoral Vein: No evidence of thrombus. Normal compressibility, respiratory phasicity and response to augmentation.  Saphenofemoral Junction: No evidence of thrombus. Normal compressibility and flow on color Doppler imaging.  Profunda Femoral Vein: No evidence of thrombus. Normal compressibility and flow on color Doppler imaging.  Femoral Vein: No evidence of thrombus. Normal compressibility, respiratory phasicity and response to augmentation.  Popliteal Vein: No evidence of thrombus. Normal compressibility, respiratory phasicity and response to augmentation.  Calf Veins: No evidence of thrombus. Normal compressibility and flow on color Doppler imaging.  Superficial Great Saphenous Vein: No evidence of thrombus. Normal compressibility and flow on color Doppler imaging.  Venous Reflux:  None.  Other Findings:  None.  LEFT LOWER EXTREMITY  Common Femoral Vein: No evidence of thrombus. Normal compressibility, respiratory phasicity and response to augmentation.  Saphenofemoral Junction: No evidence of thrombus. Normal compressibility and flow on color Doppler imaging.  Profunda Femoral Vein: No evidence of thrombus. Normal compressibility and flow on color Doppler imaging.  Femoral Vein: No evidence of thrombus. Normal compressibility, respiratory phasicity and response to augmentation.  Popliteal Vein: No evidence of thrombus. Normal compressibility, respiratory phasicity and response to augmentation.  Calf Veins: No evidence of thrombus. Normal compressibility and flow on color Doppler imaging.  Superficial Great Saphenous Vein: No evidence of thrombus.  Normal compressibility and flow on color Doppler imaging.  Venous Reflux:  None.  Other Findings:  None.  IMPRESSION: Negative venous Doppler examination for deep venous thrombosis in bilateral lower extremities.   Electronically Signed   By: Kalman Jewels M.D.   On: 11/02/2013 18:14   Dg Chest Port 1 View  11/02/2013   CLINICAL DATA:  Chest pain.  EXAM: PORTABLE CHEST - 1 VIEW  COMPARISON:  None.  FINDINGS: Lungs are clear. Heart size is normal. No pneumothorax or pleural effusion. No focal bony abnormality.  IMPRESSION: Negative chest.   Electronically Signed   By: Inge Rise M.D.   On: 11/02/2013 14:12    Scheduled Meds: . heparin subcutaneous  5,000 Units Subcutaneous 3 times per day  . mometasone-formoterol  2 puff Inhalation BID  . nicotine  21 mg Transdermal Daily  . sodium chloride  3 mL Intravenous Q12H   Continuous Infusions: . sodium chloride 150 mL/hr at 11/03/13 0900    Principal Problem:   Acute kidney injury Active Problems:   Pulmonary embolism   Hypotension   Hyponatremia   Tobacco use  disorder   ETOH abuse    Time spent: >30 minutes    [facility] Pager [phone]. If 7PM-7AM, please contact night-coverage at [email or web address], password [facility] 11/03/2013, 10:28 AM  LOS: 1 day

## 2013-11-03 NOTE — Progress Notes (Signed)
UR chart review completed.  

## 2013-11-04 DIAGNOSIS — I10 Essential (primary) hypertension: Secondary | ICD-10-CM

## 2013-11-04 LAB — CBC
HEMATOCRIT: 36.6 % — AB (ref 39.0–52.0)
HEMOGLOBIN: 12.9 g/dL — AB (ref 13.0–17.0)
MCH: 36.9 pg — ABNORMAL HIGH (ref 26.0–34.0)
MCHC: 35.2 g/dL (ref 30.0–36.0)
MCV: 104.6 fL — ABNORMAL HIGH (ref 78.0–100.0)
Platelets: 312 10*3/uL (ref 150–400)
RBC: 3.5 MIL/uL — ABNORMAL LOW (ref 4.22–5.81)
RDW: 12.7 % (ref 11.5–15.5)
WBC: 10 10*3/uL (ref 4.0–10.5)

## 2013-11-04 LAB — BASIC METABOLIC PANEL
BUN: 35 mg/dL — AB (ref 6–23)
CHLORIDE: 106 meq/L (ref 96–112)
CO2: 23 mEq/L (ref 19–32)
Calcium: 8.8 mg/dL (ref 8.4–10.5)
Creatinine, Ser: 1.04 mg/dL (ref 0.50–1.35)
GFR calc non Af Amer: 80 mL/min — ABNORMAL LOW (ref >90)
Glucose, Bld: 102 mg/dL — ABNORMAL HIGH (ref 70–99)
POTASSIUM: 4.4 meq/L (ref 3.7–5.3)
Sodium: 139 mEq/L (ref 137–147)

## 2013-11-04 LAB — VITAMIN B12: Vitamin B-12: 251 pg/mL (ref 211–911)

## 2013-11-04 LAB — TSH: TSH: 0.567 u[IU]/mL (ref 0.350–4.500)

## 2013-11-04 MED ORDER — CYCLOBENZAPRINE HCL 5 MG PO TABS: 5.0000 mg | ORAL_TABLET | Freq: Two times a day (BID) | ORAL | Status: DC | PRN

## 2013-11-04 MED ORDER — METOPROLOL TARTRATE 12.5 MG HALF TABLET: 12.5000 mg | ORAL_TABLET | Freq: Two times a day (BID) | ORAL | Status: DC

## 2013-11-04 MED ORDER — NICOTINE 21 MG/24HR TD PT24: 21.0000 mg | MEDICATED_PATCH | Freq: Every day | TRANSDERMAL | Status: DC

## 2013-11-04 MED ORDER — FLUTICASONE-SALMETEROL 115-21 MCG/ACT IN AERO: 2.0000 | INHALATION_SPRAY | Freq: Two times a day (BID) | RESPIRATORY_TRACT | Status: DC

## 2013-11-04 MED ORDER — ADULT MULTIVITAMIN W/MINERALS CH: 1.0000 | ORAL_TABLET | Freq: Every day | ORAL | Status: DC

## 2013-11-04 NOTE — Progress Notes (Signed)
Pt discharged home today per Dr. Dyann Kief. Pt's IV site D/C'd and WNL. Pt's VSS. Pt provided with home medication list, discharge instructions and prescriptions. Verbalized understanding. Pt ambulated off floor in stable condition accompanied by nursing student, Cesalea.

## 2013-11-04 NOTE — Discharge Summary (Signed)
Physician Discharge Summary  Dakota Patterson HKV:[id number] DOB: 1960-08-17 DOA: 11/02/2013  PCP: Blyn date: 11/02/2013 Discharge date: 11/04/2013  Time spent: >30 minutes  Recommendations for Outpatient Follow-up:  1. Reassess BP and adjust medications as needed 2. Support and follow progress of smoking cessation 3. BMET to follow electrolytes and renal function   Discharge Diagnoses:  Principal Problem:   Acute kidney injury Active Problems:   Hypotension   Hyponatremia   Tobacco use disorder   ETOH abuse   Discharge Condition: stable and improved. Will discharge home with PCP follow up in 10 days.  Diet recommendation: low sodium diet  Filed Weights   11/02/13 1242 11/02/13 1704 11/03/13 0500  Weight: 61.236 kg (135 lb) 50.9 kg (112 lb 3.4 oz) 50.9 kg (112 lb 3.4 oz)    History of present illness:  53 y/o male with hx of etoh abuse, tobacco abuse who has been on antihypertensives for past 3 months for uncontrolled blood pressure with readings of 200s/ 100s mmhg was brought to the hospital for episodes of low blood pressure with dizziness for past 3 days. Patient reports that for past 10 days she has been having mid back pain with spasms radiating to the neck. 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 Patient has been on 25 mg of metoprolol along with lisinopril and HCTZ for past 3 months for his blood pressure. He reports taking 600-800 mg of ibuprofen twice daily for up to 2-3 days for the back pain. About one week back he had vital gastroenteritis with several episodes of nausea and vomiting and watery diarrhea that lasted for 3 days . He was unable to keep anything down at that time . He recovered from the symptoms but since then has not been able to eat or drink well and also has not been urinating much . He reports taking few more tablets of ibuprofen last 2 days for the back pain.  For past week he has not been  urinating much.  His wife recorded his blood pressure for past 3 days and has been reading 78/57, 59/45, 59/43, 75/56, 72/52, 71/48, 63/49, 70/50, 68/54 . Patient has been feeling lightheaded with poor appetite for the same duration and has not taken his blood pressure medications since then. He denies any headache, chest pain, shortness of breath, palpitations, numbness of his extremities, weakness, fevers, chills, leg swellings or further nausea, vomiting or diarrhea. Denies any sick contacts. Denies any exposure to organophosphorus or arsenic compounds. Denies recent travel.   Hospital Course:  1-Hypotension with hx of HTN: due to dehydration and continue use of antihypertensive agents.  -responded to IVF's resuscitation and stopping antihypertensive drugs  -at discharge will hold lisinopril and HCTZ until follow up with PCP; patient advised to make a BP log and to follow low sodium diet -encourage to maintain good hydration, to stop drinking and smoking  2-Acute renal failure: multifactorial: secondary to hypotension, NSAIDS and conitnue use of ACE/HCTZ  -resolved with IVF's -UA and renal US WNL -continue holding HCTZ/ACE and advised to avoid use of NSAID's  -at discharge Cr 1.04  3-tobacco abuse/alcohol abuse: cessation counseling provided.  -no withdrawal seen while on CIWA assessment during admission -patient advised to use Multivitamins to supply B1 and folic acid -continue nicotine patch and nicotin gum.   4-HTN: stable with the use only of metoprolol -as mentioned above, will hold HCT/ACE -advise to stop smoking and drinking -patient instructed to  follow a low sodium diet -close follow up with PCP   5-hyponatremia and elevated MCV: most likely related to alcohol. Also low sodium due to dehydration and use of HCTZ.  -will follow B12, TSH level and folate  -electrolytes at discharge WNL -advise to maintain himself well hydrated and stop alcohol use -will hold HCTZ for  now  6-back pain: appears to be muscular in nature  -continue use of PRN tylenol and flexeril   Procedures:  Renal US: 1. Normal exam. No hydronephrosis.   V/Q scan: 1. Low probability for clinically significant pulmonary embolus.    LE dopplers: 1. Negative venous Doppler examination for deep venous thrombosis in  bilateral lower extremities.  Consultations:  None   Discharge Exam: Filed Vitals:   11/04/13 0445  BP: 129/82  Pulse: 74  Temp: 97.3 F (36.3 C)  Resp: 16   General: NAD, afebrile, feeling better; no acute complaints  Cardiovascular: S1 and S2,2 no rub so rgallops  Respiratory: CTA bilaterally  Abdomen: soft, NT, ND, positive BS  Musculoskeletal: no numbness, no decrease strength on his extremities; denying any pain today.     Discharge Instructions You were cared for by a hospitalist during your hospital stay. If you have any questions about your discharge medications or the care you received while you were in the hospital after you are discharged, you can call the unit and asked to speak with the hospitalist on call if the hospitalist that took care of you is not available. Once you are discharged, your primary care physician will handle any further medical issues. Please note that NO REFILLS for any discharge medications will be authorized once you are discharged, as it is imperative that you return to your primary care physician (or establish a relationship with a primary care physician if you do not have one) for your aftercare needs so that they can reassess your need for medications and monitor your lab values.  Discharge Orders   Future Orders Complete By Expires   Diet - low sodium heart healthy  As directed    Discharge instructions  As directed        Medication List    STOP taking these medications       lisinopril-hydrochlorothiazide 20-12.5 MG per tablet  Commonly known as:  PRINZIDE,ZESTORETIC      TAKE these medications        cyclobenzaprine 5 MG tablet  Commonly known as:  FLEXERIL  Take 1 tablet (5 mg total) by mouth every 12 (twelve) hours as needed for muscle spasms (back pain).     fluticasone-salmeterol 115-21 MCG/ACT inhaler  Commonly known as:  ADVAIR HFA  Inhale 2 puffs into the lungs 2 (two) times daily.     metoprolol tartrate 12.5 mg Tabs tablet  Commonly known as:  LOPRESSOR  Take 0.5 tablets (12.5 mg total) by mouth 2 (two) times daily.     multivitamin with minerals Tabs tablet  Take 1 tablet by mouth daily.     nicotine 21 mg/24hr patch  Commonly known as:  NICODERM CQ - dosed in mg/24 hours  Place 1 patch (21 mg total) onto the skin daily.       No Known Allergies     Follow-up Information   Follow up with [facility] In 10 days.   Contact information:   [address] [place] [address] [phone]        The results of significant diagnostics from this hospitalization (including  imaging, microbiology, ancillary and laboratory) are listed below for reference.    Significant Diagnostic Studies: Mr Jodene Nam Chest Wo Contrast  11/02/2013   CLINICAL DATA:  History of hypertension, now with back pain radiating to the neck. Patient with elevated creatinine and does cannot receive intravenous contrast. Please evaluate for thoracic aortic aneurysm and/or dissection.  EXAM: MRA CHEST WITHOUT CONTRAST  TECHNIQUE: Angiographic images of the chest were obtained using MRA technique without intravenous contrast.  CONTRAST:  None  COMPARISON:  DG CHEST 1V PORT dated 11/02/2013  FINDINGS: Vascular Findings:  The thoracic aorta is of normal caliber with measurements as follows. No definite thoracic aortic dissection or periaortic stranding on this noncontrast examination.  Bovine configuration of the aortic arch is suspected. The branch vessels of the aortic arch widely patent throughout their imaged course.  Normal heart size. No pericardial effusion. Although this examination  was not tailored for the evaluation the pulmonary arteries, there are discrete filling defect in the central pulmonary arterial tree to suggest central pulmonary embolism. Normal caliber of the main pulmonary artery.  -------------------------------------------------------------  Thoracic aortic measurements:  Sinotubular junction  35 mm as measured in greatest oblique coronal dimension.  Proximal ascending aorta  27 mm as measured in greatest oblique axial dimension at the level of the main pulmonary artery and approximately 28 mm in greatest oblique sagittal dimension (image 13, series 12).  Aortic arch aorta  20 mm as measured in greatest oblique sagittal dimension.  Proximal descending thoracic aorta  20 mm as measured in greatest oblique axial dimension at the level of the main pulmonary artery.  Distal descending thoracic aorta  20 mm as measured in greatest oblique axial dimension at the level of the diaphragmatic hiatus.  Review of the MIP images confirms the above findings.  -------------------------------------------------------------  Non-Vascular Findings:  No focal airspace opacities.  No pleural effusion.  No bulky mediastinal or hilar lymphadenopathy on this noncontrast examination.  IMPRESSION: No acute cardiopulmonary disease on this noncontrast examination. Specifically, no evidence of thoracic aortic aneurysm or dissection.   Electronically Signed   By: Sandi Mariscal M.D.   On: 11/02/2013 15:34   US Renal  11/03/2013   CLINICAL DATA:  Hypertension and diabetes.  Acute tubular necrosis  EXAM: RENAL/URINARY TRACT ULTRASOUND COMPLETE  COMPARISON:  None.  FINDINGS: Right Kidney:  Length: 10.2 cm. Echogenicity within normal limits. No mass or hydronephrosis visualized.  Left Kidney:  Length: 10.2 cm. Echogenicity within normal limits. No mass or hydronephrosis visualized. Echogenic structure within the inferior pole of the left kidney may represent a small stone.  Bladder:  Appears normal for degree of  bladder distention.  IMPRESSION: 1. Normal exam.  No hydronephrosis.   Electronically Signed   By: Kerby Moors M.D.   On: 11/03/2013 09:02   Nm Pulmonary Perf And Vent  11/02/2013   CLINICAL DATA:  Rule out pulmonary embolus.  EXAM: NUCLEAR MEDICINE VENTILATION - PERFUSION LUNG SCAN  TECHNIQUE: Ventilation images were obtained in multiple projections using inhaled aerosol technetium 99 M DTPA. Perfusion images were obtained in multiple projections after intravenous injection of Tc-44m MAA.  RADIOPHARMACEUTICALS:  40 mCi Tc-42m DTPA aerosol and 6.0 mCi Tc-110m MAA  COMPARISON:  Chest x-ray 11/02/2013  FINDINGS: Ventilation: There is minimal hypoventilation at the posterior lung bases.  Perfusion: There is minimal hypoperfusion at the posterior lung bases, matched with the ventilation defect. There is no associated chest x-ray finding. Findings are consistent with minimal atelectasis. No suspicious wedge-shaped pleural-based defects are  identified.  IMPRESSION: Low probability for clinically significant pulmonary embolus.   Electronically Signed   By: Shon Hale M.D.   On: 11/02/2013 21:01   US Venous Img Lower Bilateral  11/02/2013   CLINICAL DATA:  Pulmonary embolism.  EXAM: BILATERAL LOWER EXTREMITY VENOUS DOPPLER ULTRASOUND  TECHNIQUE: Gray-scale sonography with graded compression, as well as color Doppler and duplex ultrasound were performed to evaluate the lower extremity deep venous systems from the level of the common femoral vein and including the common femoral, femoral, profunda femoral, popliteal and calf veins including the posterior tibial, peroneal and gastrocnemius veins when visible. The superficial great saphenous vein was also interrogated. Spectral Doppler was utilized to evaluate flow at rest and with distal augmentation maneuvers in the common femoral, femoral and popliteal veins.  COMPARISON:  None.  FINDINGS: RIGHT LOWER EXTREMITY  Common Femoral Vein: No evidence of thrombus. Normal  compressibility, respiratory phasicity and response to augmentation.  Saphenofemoral Junction: No evidence of thrombus. Normal compressibility and flow on color Doppler imaging.  Profunda Femoral Vein: No evidence of thrombus. Normal compressibility and flow on color Doppler imaging.  Femoral Vein: No evidence of thrombus. Normal compressibility, respiratory phasicity and response to augmentation.  Popliteal Vein: No evidence of thrombus. Normal compressibility, respiratory phasicity and response to augmentation.  Calf Veins: No evidence of thrombus. Normal compressibility and flow on color Doppler imaging.  Superficial Great Saphenous Vein: No evidence of thrombus. Normal compressibility and flow on color Doppler imaging.  Venous Reflux:  None.  Other Findings:  None.  LEFT LOWER EXTREMITY  Common Femoral Vein: No evidence of thrombus. Normal compressibility, respiratory phasicity and response to augmentation.  Saphenofemoral Junction: No evidence of thrombus. Normal compressibility and flow on color Doppler imaging.  Profunda Femoral Vein: No evidence of thrombus. Normal compressibility and flow on color Doppler imaging.  Femoral Vein: No evidence of thrombus. Normal compressibility, respiratory phasicity and response to augmentation.  Popliteal Vein: No evidence of thrombus. Normal compressibility, respiratory phasicity and response to augmentation.  Calf Veins: No evidence of thrombus. Normal compressibility and flow on color Doppler imaging.  Superficial Great Saphenous Vein: No evidence of thrombus. Normal compressibility and flow on color Doppler imaging.  Venous Reflux:  None.  Other Findings:  None.  IMPRESSION: Negative venous Doppler examination for deep venous thrombosis in bilateral lower extremities.   Electronically Signed   By: Kalman Jewels M.D.   On: 11/02/2013 18:14   Dg Chest Port 1 View  11/02/2013   CLINICAL DATA:  Chest pain.  EXAM: PORTABLE CHEST - 1 VIEW  COMPARISON:  None.  FINDINGS:  Lungs are clear. Heart size is normal. No pneumothorax or pleural effusion. No focal bony abnormality.  IMPRESSION: Negative chest.   Electronically Signed   By: Inge Rise M.D.   On: 11/02/2013 14:12    Microbiology: Recent Results (from the past 240 hour(s))  MRSA PCR SCREENING     Status: None   Collection Time    11/02/13  5:34 PM      Result Value Ref Range Status   MRSA by PCR NEGATIVE  NEGATIVE Final   Comment:            The GeneXpert MRSA Assay (FDA     approved for NASAL specimens     only), is one component of a     comprehensive MRSA colonization     surveillance program. It is not     intended to diagnose MRSA     infection  nor to guide or     monitor treatment for     MRSA infections.     Labs: Basic Metabolic Panel:  Recent Labs Lab 11/02/13 1334 11/02/13 1353 11/03/13 0809 11/04/13 0437  NA 126* 125* 135* 139  K 4.0 3.9 4.5 4.4  CL 84* 91* 102 106  CO2 24  --  23 23  GLUCOSE 158* 150* 107* 102*  BUN 111* 122* 76* 35*  CREATININE 4.65* 4.70* 1.88* 1.04  CALCIUM 9.9  --  9.0 8.8   Liver Function Tests:  Recent Labs Lab 11/02/13 1334  AST 28  ALT 43  ALKPHOS 36*  BILITOT 0.2*  PROT 7.2  ALBUMIN 3.7   CBC:  Recent Labs Lab 11/02/13 1334 11/02/13 1353 11/04/13 0437  WBC 13.1*  --  10.0  HGB 15.5 16.3 12.9*  HCT 42.7 48.0 36.6*  MCV 102.2*  --  104.6*  PLT 287  --  312   Cardiac Enzymes:  Recent Labs Lab 11/02/13 1334  TROPONINI <0.30    Signed:  Barton Dubois  [facility] 11/04/2013, 11:54 AM

## 2013-11-05 LAB — FOLATE RBC: RBC Folate: 550 ng/mL (ref >280)

## 2015-09-28 ENCOUNTER — Encounter (HOSPITAL_COMMUNITY): Payer: Self-pay | Admitting: Emergency Medicine

## 2015-09-28 ENCOUNTER — Emergency Department (HOSPITAL_COMMUNITY): Payer: No Typology Code available for payment source

## 2015-09-28 ENCOUNTER — Inpatient Hospital Stay (HOSPITAL_COMMUNITY)
Admission: EM | Admit: 2015-09-28 | Discharge: 2015-10-03 | DRG: 200 | Disposition: A | Discharge status: Home / Self Care | Payer: No Typology Code available for payment source | Attending: General Surgery | Admitting: General Surgery

## 2015-09-28 ENCOUNTER — Other Ambulatory Visit: Payer: Self-pay

## 2015-09-28 DIAGNOSIS — R0902 Hypoxemia: Secondary | ICD-10-CM | POA: Diagnosis not present

## 2015-09-28 DIAGNOSIS — F17210 Nicotine dependence, cigarettes, uncomplicated: Secondary | ICD-10-CM | POA: Diagnosis present

## 2015-09-28 DIAGNOSIS — F1210 Cannabis abuse, uncomplicated: Secondary | ICD-10-CM | POA: Diagnosis present

## 2015-09-28 DIAGNOSIS — S2242XA Multiple fractures of ribs, left side, initial encounter for closed fracture: Secondary | ICD-10-CM | POA: Diagnosis present

## 2015-09-28 DIAGNOSIS — Z23 Encounter for immunization: Secondary | ICD-10-CM

## 2015-09-28 DIAGNOSIS — F10129 Alcohol abuse with intoxication, unspecified: Secondary | ICD-10-CM | POA: Diagnosis present

## 2015-09-28 DIAGNOSIS — M542 Cervicalgia: Secondary | ICD-10-CM

## 2015-09-28 DIAGNOSIS — J45909 Unspecified asthma, uncomplicated: Secondary | ICD-10-CM | POA: Diagnosis present

## 2015-09-28 DIAGNOSIS — S270XXA Traumatic pneumothorax, initial encounter: Secondary | ICD-10-CM | POA: Diagnosis present

## 2015-09-28 DIAGNOSIS — E7800 Pure hypercholesterolemia, unspecified: Secondary | ICD-10-CM | POA: Diagnosis present

## 2015-09-28 DIAGNOSIS — J939 Pneumothorax, unspecified: Secondary | ICD-10-CM

## 2015-09-28 DIAGNOSIS — I10 Essential (primary) hypertension: Secondary | ICD-10-CM | POA: Diagnosis present

## 2015-09-28 DIAGNOSIS — S2249XA Multiple fractures of ribs, unspecified side, initial encounter for closed fracture: Secondary | ICD-10-CM | POA: Diagnosis present

## 2015-09-28 DIAGNOSIS — S32039A Unspecified fracture of third lumbar vertebra, initial encounter for closed fracture: Secondary | ICD-10-CM | POA: Diagnosis present

## 2015-09-28 DIAGNOSIS — R339 Retention of urine, unspecified: Secondary | ICD-10-CM | POA: Diagnosis not present

## 2015-09-28 DIAGNOSIS — E876 Hypokalemia: Secondary | ICD-10-CM | POA: Diagnosis present

## 2015-09-28 DIAGNOSIS — R338 Other retention of urine: Secondary | ICD-10-CM | POA: Diagnosis present

## 2015-09-28 DIAGNOSIS — S272XXA Traumatic hemopneumothorax, initial encounter: Secondary | ICD-10-CM

## 2015-09-28 HISTORY — DX: Pure hypercholesterolemia, unspecified: E78.00

## 2015-09-28 HISTORY — DX: Gastro-esophageal reflux disease without esophagitis: K21.9

## 2015-09-28 LAB — COMPREHENSIVE METABOLIC PANEL
ALT: 72 U/L — AB (ref 17–63)
ANION GAP: 19 — AB (ref 5–15)
AST: 107 U/L — ABNORMAL HIGH (ref 15–41)
Albumin: 4.2 g/dL (ref 3.5–5.0)
Alkaline Phosphatase: 29 U/L — ABNORMAL LOW (ref 38–126)
BUN: 14 mg/dL (ref 6–20)
CALCIUM: 9.7 mg/dL (ref 8.9–10.3)
CHLORIDE: 97 mmol/L — AB (ref 101–111)
CO2: 21 mmol/L — ABNORMAL LOW (ref 22–32)
CREATININE: 1.09 mg/dL (ref 0.61–1.24)
Glucose, Bld: 126 mg/dL — ABNORMAL HIGH (ref 65–99)
Potassium: 2.8 mmol/L — ABNORMAL LOW (ref 3.5–5.1)
Sodium: 137 mmol/L (ref 135–145)
Total Bilirubin: 0.4 mg/dL (ref 0.3–1.2)
Total Protein: 7.6 g/dL (ref 6.5–8.1)

## 2015-09-28 LAB — CBC
HCT: 43 % (ref 39.0–52.0)
Hemoglobin: 15.2 g/dL (ref 13.0–17.0)
MCH: 37 pg — AB (ref 26.0–34.0)
MCHC: 35.3 g/dL (ref 30.0–36.0)
MCV: 104.6 fL — AB (ref 78.0–100.0)
PLATELETS: 247 10*3/uL (ref 150–400)
RBC: 4.11 MIL/uL — AB (ref 4.22–5.81)
RDW: 12.2 % (ref 11.5–15.5)
WBC: 20.5 10*3/uL — ABNORMAL HIGH (ref 4.0–10.5)

## 2015-09-28 LAB — TYPE AND SCREEN
ABO/RH(D): A POS
Antibody Screen: NEGATIVE
Unit division: 0
Unit division: 0

## 2015-09-28 LAB — PREPARE FRESH FROZEN PLASMA
UNIT DIVISION: 0
Unit division: 0

## 2015-09-28 LAB — PROTIME-INR
INR: 0.94 (ref 0.00–1.49)
PROTHROMBIN TIME: 12.8 s (ref 11.6–15.2)

## 2015-09-28 LAB — ETHANOL: ALCOHOL ETHYL (B): 211 mg/dL — AB (ref <5)

## 2015-09-28 MED ORDER — SODIUM CHLORIDE 0.9 % IV BOLUS (SEPSIS)
1000.0000 mL | Freq: Once | INTRAVENOUS | Status: AC
  Administered 2015-09-28: 1000 mL via INTRAVENOUS

## 2015-09-28 MED ORDER — POTASSIUM CHLORIDE CRYS ER 20 MEQ PO TBCR
40.0000 meq | EXTENDED_RELEASE_TABLET | Freq: Every day | ORAL | Status: DC
Start: 2015-09-29 — End: 2015-10-03
  Administered 2015-09-29 – 2015-10-03 (×5): 40 meq via ORAL
  Filled 2015-09-28 (×5): qty 2

## 2015-09-28 MED ORDER — FENTANYL CITRATE (PF) 100 MCG/2ML IJ SOLN
INTRAMUSCULAR | Status: AC | PRN
  Administered 2015-09-28: 50 ug via INTRAVENOUS

## 2015-09-28 MED ORDER — FENTANYL CITRATE (PF) 100 MCG/2ML IJ SOLN
50.0000 ug | Freq: Once | INTRAMUSCULAR | Status: AC
  Administered 2015-09-28: 50 ug via INTRAVENOUS
  Filled 2015-09-28: qty 2

## 2015-09-28 MED ORDER — POTASSIUM CHLORIDE 10 MEQ/100ML IV SOLN
10.0000 meq | INTRAVENOUS | Status: AC
  Administered 2015-09-29 (×6): 10 meq via INTRAVENOUS
  Filled 2015-09-28 (×6): qty 100

## 2015-09-28 NOTE — Progress Notes (Signed)
Orthopedic Tech Progress Note Patient Details:  Dakota Patterson 1961/05/08 [id number] Level 1 trauma ortho visit. Patient ID: Dakota Patterson, male   DOB: August 31, 1960, 55 y.o.   MRN: [id number]   Dakota Patterson 09/28/2015, 9:26 PM
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
## 2015-09-28 NOTE — Op Note (Signed)
Left tube thoracostomy, 32 Fr  Indications:  Clinically significant Pneumothorax  Pre-operative Diagnosis: Pneumothorax  Post-operative Diagnosis: Pneumothorax  Procedure Details  Informed consent was obtained for the procedure, including sedation.  Risks of lung perforation, hemorrhage, arrhythmia, and adverse drug reaction were discussed.   After sterile skin prep, using standard technique, a 32 French tube was placed in the left anterolateral 4th rib space.  Tube was directed superiorly.  It was secured with two 0-0 silk sutures at 12 cm.  Findings: Large rush of air  Estimated Blood Loss:  Minimal         Specimens:  None              Complications:  None; patient tolerated the procedure well.         Disposition: ED, stable, HR improved, O2 improved         Condition: stable  Attending Attestation: I performed the procedure.

## 2015-09-28 NOTE — ED Notes (Signed)
Dr. Barry Dienes notified on pt.'s abnormal blood tests results ( hypokalemia / elevated ETOH and liver enzymes. )

## 2015-09-28 NOTE — ED Notes (Signed)
Transported to [facility] rm. 15 in stable condition .
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
## 2015-09-29 ENCOUNTER — Inpatient Hospital Stay (HOSPITAL_COMMUNITY): Payer: No Typology Code available for payment source

## 2015-09-29 LAB — CREATININE, SERUM
CREATININE: 1.02 mg/dL (ref 0.61–1.24)
GFR calc non Af Amer: >60 mL/min (ref >60)

## 2015-09-29 LAB — CBC
HCT: 37.3 % — ABNORMAL LOW (ref 39.0–52.0)
HCT: 37.5 % — ABNORMAL LOW (ref 39.0–52.0)
Hemoglobin: 12.9 g/dL — ABNORMAL LOW (ref 13.0–17.0)
Hemoglobin: 13.3 g/dL (ref 13.0–17.0)
MCH: 35.5 pg — ABNORMAL HIGH (ref 26.0–34.0)
MCH: 36.8 pg — ABNORMAL HIGH (ref 26.0–34.0)
MCHC: 34.6 g/dL (ref 30.0–36.0)
MCHC: 35.5 g/dL (ref 30.0–36.0)
MCV: 102.8 fL — ABNORMAL HIGH (ref 78.0–100.0)
MCV: 103.9 fL — ABNORMAL HIGH (ref 78.0–100.0)
PLATELETS: 227 10*3/uL (ref 150–400)
Platelets: ADEQUATE 10*3/uL (ref 150–400)
RBC: 3.61 MIL/uL — ABNORMAL LOW (ref 4.22–5.81)
RBC: 3.63 MIL/uL — AB (ref 4.22–5.81)
RDW: 12 % (ref 11.5–15.5)
RDW: 12.3 % (ref 11.5–15.5)
WBC: 21.8 10*3/uL — ABNORMAL HIGH (ref 4.0–10.5)
WBC: 24.5 10*3/uL — AB (ref 4.0–10.5)

## 2015-09-29 LAB — RAPID URINE DRUG SCREEN, HOSP PERFORMED
AMPHETAMINES: NOT DETECTED
BARBITURATES: NOT DETECTED
BENZODIAZEPINES: NOT DETECTED
COCAINE: NOT DETECTED
Opiates: POSITIVE — AB
TETRAHYDROCANNABINOL: POSITIVE — AB

## 2015-09-29 LAB — BASIC METABOLIC PANEL
ANION GAP: 11 (ref 5–15)
BUN: 11 mg/dL (ref 6–20)
CALCIUM: 8.8 mg/dL — AB (ref 8.9–10.3)
CO2: 23 mmol/L (ref 22–32)
CREATININE: 0.87 mg/dL (ref 0.61–1.24)
Chloride: 105 mmol/L (ref 101–111)
GFR calc Af Amer: >60 mL/min (ref >60)
GLUCOSE: 153 mg/dL — AB (ref 65–99)
Potassium: 4.4 mmol/L (ref 3.5–5.1)
Sodium: 139 mmol/L (ref 135–145)

## 2015-09-29 LAB — MRSA PCR SCREENING: MRSA BY PCR: NEGATIVE

## 2015-09-29 LAB — URINALYSIS, ROUTINE W REFLEX MICROSCOPIC
BILIRUBIN URINE: NEGATIVE
Glucose, UA: NEGATIVE mg/dL
Ketones, ur: NEGATIVE mg/dL
NITRITE: NEGATIVE
PH: 5.5 (ref 5.0–8.0)
Protein, ur: NEGATIVE mg/dL
SPECIFIC GRAVITY, URINE: 1.021 (ref 1.005–1.030)

## 2015-09-29 LAB — ABO/RH: ABO/RH(D): A POS

## 2015-09-29 LAB — URINE MICROSCOPIC-ADD ON: BACTERIA UA: NONE SEEN

## 2015-09-29 MED ORDER — INFLUENZA VAC SPLIT QUAD 0.5 ML IM SUSY
0.5000 mL | PREFILLED_SYRINGE | INTRAMUSCULAR | Status: AC
  Administered 2015-09-30: 0.5 mL via INTRAMUSCULAR
  Filled 2015-09-29: qty 1
  Filled 2015-09-29: qty 0.5

## 2015-09-29 MED ORDER — METOPROLOL TARTRATE 1 MG/ML IV SOLN
2.5000 mg | Freq: Four times a day (QID) | INTRAVENOUS | Status: DC
  Administered 2015-09-29 – 2015-10-03 (×18): 2.5 mg via INTRAVENOUS
  Filled 2015-09-29 (×17): qty 5

## 2015-09-29 MED ORDER — ADULT MULTIVITAMIN W/MINERALS CH
1.0000 | ORAL_TABLET | Freq: Every day | ORAL | Status: DC
Start: 2015-09-29 — End: 2015-10-03
  Administered 2015-09-29 – 2015-10-03 (×5): 1 via ORAL
  Filled 2015-09-29 (×5): qty 1

## 2015-09-29 MED ORDER — LORAZEPAM 1 MG PO TABS: 1.0000 mg | ORAL_TABLET | Freq: Four times a day (QID) | ORAL | Status: AC | PRN

## 2015-09-29 MED ORDER — CEFAZOLIN SODIUM-DEXTROSE 2-4 GM/100ML-% IV SOLN
2.0000 g | Freq: Once | INTRAVENOUS | Status: AC
  Administered 2015-09-29: 2 g via INTRAVENOUS
  Filled 2015-09-29: qty 100

## 2015-09-29 MED ORDER — PANTOPRAZOLE SODIUM 40 MG PO TBEC
40.0000 mg | DELAYED_RELEASE_TABLET | Freq: Every day | ORAL | Status: DC
Start: 2015-09-29 — End: 2015-09-30
  Administered 2015-09-29 – 2015-09-30 (×2): 40 mg via ORAL
  Filled 2015-09-29 (×2): qty 1

## 2015-09-29 MED ORDER — DOCUSATE SODIUM 100 MG PO CAPS
100.0000 mg | ORAL_CAPSULE | Freq: Two times a day (BID) | ORAL | Status: DC
  Administered 2015-09-29 – 2015-10-03 (×9): 100 mg via ORAL
  Filled 2015-09-29 (×9): qty 1

## 2015-09-29 MED ORDER — METOPROLOL TARTRATE 1 MG/ML IV SOLN: 5.0000 mg | INTRAVENOUS | Status: DC

## 2015-09-29 MED ORDER — METOPROLOL TARTRATE 12.5 MG HALF TABLET: 12.5000 mg | ORAL_TABLET | Freq: Two times a day (BID) | ORAL | Status: DC

## 2015-09-29 MED ORDER — LORAZEPAM 2 MG/ML IJ SOLN
0.0000 mg | Freq: Four times a day (QID) | INTRAMUSCULAR | Status: DC
  Administered 2015-09-30: 2 mg via INTRAVENOUS
  Filled 2015-09-29: qty 1

## 2015-09-29 MED ORDER — METOPROLOL TARTRATE 1 MG/ML IV SOLN
2.5000 mg | Freq: Four times a day (QID) | INTRAVENOUS | Status: DC | PRN
  Administered 2015-09-30: 2.5 mg via INTRAVENOUS
  Filled 2015-09-29 (×2): qty 5

## 2015-09-29 MED ORDER — MORPHINE SULFATE (PF) 2 MG/ML IV SOLN
1.0000 mg | INTRAVENOUS | Status: DC | PRN
  Administered 2015-09-29 (×2): 4 mg via INTRAVENOUS
  Administered 2015-09-29 (×2): 2 mg via INTRAVENOUS
  Administered 2015-09-29: 4 mg via INTRAVENOUS
  Administered 2015-09-30: 2 mg via INTRAVENOUS
  Filled 2015-09-29: qty 2
  Filled 2015-09-29 (×3): qty 1
  Filled 2015-09-29 (×2): qty 2

## 2015-09-29 MED ORDER — PANTOPRAZOLE SODIUM 40 MG IV SOLR
40.0000 mg | Freq: Every day | INTRAVENOUS | Status: DC
  Filled 2015-09-29: qty 40

## 2015-09-29 MED ORDER — LORAZEPAM 2 MG/ML IJ SOLN: 0.0000 mg | Freq: Two times a day (BID) | INTRAMUSCULAR | Status: DC

## 2015-09-29 MED ORDER — NICOTINE 21 MG/24HR TD PT24
21.0000 mg | MEDICATED_PATCH | Freq: Every day | TRANSDERMAL | Status: DC
  Administered 2015-09-29 – 2015-10-03 (×6): 21 mg via TRANSDERMAL
  Filled 2015-09-29 (×6): qty 1

## 2015-09-29 MED ORDER — ONDANSETRON HCL 4 MG/2ML IJ SOLN: 4.0000 mg | Freq: Four times a day (QID) | INTRAMUSCULAR | Status: DC | PRN

## 2015-09-29 MED ORDER — ENOXAPARIN SODIUM 40 MG/0.4ML ~~LOC~~ SOLN
40.0000 mg | SUBCUTANEOUS | Status: DC
  Administered 2015-09-29 – 2015-10-03 (×5): 40 mg via SUBCUTANEOUS
  Filled 2015-09-29 (×5): qty 0.4

## 2015-09-29 MED ORDER — LORAZEPAM 2 MG/ML IJ SOLN: 1.0000 mg | Freq: Four times a day (QID) | INTRAMUSCULAR | Status: AC | PRN

## 2015-09-29 MED ORDER — PNEUMOCOCCAL VAC POLYVALENT 25 MCG/0.5ML IJ INJ
0.5000 mL | INJECTION | INTRAMUSCULAR | Status: AC
  Administered 2015-09-30: 0.5 mL via INTRAMUSCULAR
  Filled 2015-09-29: qty 0.5
  Filled 2015-09-29: qty 1

## 2015-09-29 MED ORDER — ACETAMINOPHEN 325 MG PO TABS: 650.0000 mg | ORAL_TABLET | ORAL | Status: DC | PRN

## 2015-09-29 MED ORDER — THIAMINE HCL 100 MG/ML IJ SOLN: 100.0000 mg | Freq: Every day | INTRAMUSCULAR | Status: DC

## 2015-09-29 MED ORDER — FOLIC ACID 5 MG/ML IJ SOLN
1.0000 mg | Freq: Every day | INTRAMUSCULAR | Status: DC
  Administered 2015-09-29 (×2): 1 mg via INTRAVENOUS
  Filled 2015-09-29 (×4): qty 0.2

## 2015-09-29 MED ORDER — MOMETASONE FURO-FORMOTEROL FUM 200-5 MCG/ACT IN AERO
2.0000 | INHALATION_SPRAY | Freq: Two times a day (BID) | RESPIRATORY_TRACT | Status: DC
Start: 2015-09-29 — End: 2015-10-03
  Administered 2015-09-29 – 2015-10-03 (×8): 2 via RESPIRATORY_TRACT
  Filled 2015-09-29: qty 8.8

## 2015-09-29 MED ORDER — HYDRALAZINE HCL 20 MG/ML IJ SOLN: 20.0000 mg | INTRAMUSCULAR | Status: DC | PRN

## 2015-09-29 MED ORDER — LACTATED RINGERS IV SOLN
INTRAVENOUS | Status: DC
  Administered 2015-09-29 (×2): via INTRAVENOUS

## 2015-09-29 MED ORDER — ONDANSETRON HCL 4 MG PO TABS: 4.0000 mg | ORAL_TABLET | Freq: Four times a day (QID) | ORAL | Status: DC | PRN

## 2015-09-29 MED ORDER — THIAMINE HCL 100 MG/ML IJ SOLN
100.0000 mg | Freq: Every day | INTRAMUSCULAR | Status: DC
  Administered 2015-09-29 (×2): 100 mg via INTRAVENOUS
  Filled 2015-09-29 (×2): qty 2

## 2015-09-29 MED ORDER — FOLIC ACID 1 MG PO TABS
1.0000 mg | ORAL_TABLET | Freq: Every day | ORAL | Status: DC
  Administered 2015-09-30 – 2015-10-03 (×4): 1 mg via ORAL
  Filled 2015-09-29 (×4): qty 1

## 2015-09-29 MED ORDER — OXYCODONE HCL 5 MG PO TABS
5.0000 mg | ORAL_TABLET | ORAL | Status: DC | PRN
  Administered 2015-09-29 – 2015-09-30 (×2): 10 mg via ORAL
  Filled 2015-09-29 (×2): qty 2

## 2015-09-29 MED ORDER — METHOCARBAMOL 500 MG PO TABS
500.0000 mg | ORAL_TABLET | Freq: Four times a day (QID) | ORAL | Status: DC | PRN
  Administered 2015-09-30 – 2015-10-02 (×4): 500 mg via ORAL
  Filled 2015-09-29 (×4): qty 1

## 2015-09-29 MED ORDER — VITAMIN B-1 100 MG PO TABS
100.0000 mg | ORAL_TABLET | Freq: Every day | ORAL | Status: DC
  Administered 2015-09-30 – 2015-10-03 (×4): 100 mg via ORAL
  Filled 2015-09-29 (×4): qty 1

## 2015-09-29 NOTE — Progress Notes (Signed)
While maintaining neck alignment, old cervical collar removed and [place] collar placed.
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
## 2015-09-29 NOTE — Progress Notes (Signed)
Orthopedic Tech Progress Note Patient Details:  Dakota Patterson Sep 20, 1960 [id number]  Ortho Devices Type of Ortho Device: [place] cervical collar Ortho Device/Splint Location: neck Ortho Device/Splint Interventions: Loanne Drilling, Loden Laurent 09/29/2015, 1:26 PM

## 2015-09-29 NOTE — Progress Notes (Addendum)
Patient arrived floor stable with family in waiting area Wife and another family member brought to the room. Wife was verbally updated over the phone by Dr. Barry Dienes. Patient did not want other family to come back to room to visit including mother. Only vistor at this time he wants is wife.

## 2015-09-30 ENCOUNTER — Inpatient Hospital Stay (HOSPITAL_COMMUNITY): Payer: No Typology Code available for payment source

## 2015-09-30 LAB — GLUCOSE, CAPILLARY
GLUCOSE-CAPILLARY: 145 mg/dL — AB (ref 65–99)
Glucose-Capillary: 154 mg/dL — ABNORMAL HIGH (ref 65–99)
Glucose-Capillary: 147 mg/dL — ABNORMAL HIGH (ref 65–99)

## 2015-09-30 MED ORDER — INSULIN ASPART 100 UNIT/ML ~~LOC~~ SOLN
0.0000 [IU] | Freq: Three times a day (TID) | SUBCUTANEOUS | Status: DC
  Administered 2015-09-30: 2 [IU] via SUBCUTANEOUS
  Administered 2015-09-30 – 2015-10-01 (×2): 3 [IU] via SUBCUTANEOUS
  Administered 2015-10-01: 2 [IU] via SUBCUTANEOUS
  Administered 2015-10-01: 3 [IU] via SUBCUTANEOUS
  Administered 2015-10-02 – 2015-10-03 (×2): 2 [IU] via SUBCUTANEOUS

## 2015-09-30 MED ORDER — MORPHINE SULFATE (PF) 2 MG/ML IV SOLN
2.0000 mg | INTRAVENOUS | Status: DC | PRN
  Administered 2015-10-01: 2 mg via INTRAVENOUS
  Filled 2015-09-30: qty 1

## 2015-09-30 MED ORDER — NAPROXEN 250 MG PO TABS
500.0000 mg | ORAL_TABLET | Freq: Two times a day (BID) | ORAL | Status: DC
  Administered 2015-09-30 – 2015-10-03 (×6): 500 mg via ORAL
  Filled 2015-09-30 (×6): qty 2

## 2015-09-30 MED ORDER — IPRATROPIUM-ALBUTEROL 0.5-2.5 (3) MG/3ML IN SOLN
3.0000 mL | Freq: Four times a day (QID) | RESPIRATORY_TRACT | Status: DC
  Filled 2015-09-30: qty 3

## 2015-09-30 MED ORDER — OXYCODONE HCL 5 MG PO TABS
5.0000 mg | ORAL_TABLET | ORAL | Status: DC | PRN
  Administered 2015-09-30: 10 mg via ORAL
  Administered 2015-09-30 – 2015-10-03 (×8): 15 mg via ORAL
  Filled 2015-09-30 (×7): qty 3
  Filled 2015-09-30: qty 2
  Filled 2015-09-30: qty 3

## 2015-09-30 MED ORDER — TAMSULOSIN HCL 0.4 MG PO CAPS
0.8000 mg | ORAL_CAPSULE | Freq: Every day | ORAL | Status: DC
  Administered 2015-09-30 – 2015-10-03 (×4): 0.8 mg via ORAL
  Filled 2015-09-30 (×4): qty 2

## 2015-09-30 MED ORDER — BETHANECHOL CHLORIDE 25 MG PO TABS
25.0000 mg | ORAL_TABLET | Freq: Four times a day (QID) | ORAL | Status: DC
  Administered 2015-09-30 – 2015-10-01 (×8): 25 mg via ORAL
  Filled 2015-09-30 (×8): qty 1

## 2015-09-30 MED ORDER — LEVALBUTEROL HCL 0.63 MG/3ML IN NEBU
0.6300 mg | INHALATION_SOLUTION | Freq: Four times a day (QID) | RESPIRATORY_TRACT | Status: DC
  Administered 2015-09-30 – 2015-10-01 (×4): 0.63 mg via RESPIRATORY_TRACT
  Filled 2015-09-30 (×6): qty 3

## 2015-09-30 MED ORDER — IOHEXOL 300 MG/ML  SOLN
100.0000 mL | Freq: Once | INTRAMUSCULAR | Status: AC | PRN
  Administered 2015-09-28: 100 mL via INTRAVENOUS

## 2015-09-30 MED ORDER — POLYETHYLENE GLYCOL 3350 17 G PO PACK
17.0000 g | PACK | Freq: Every day | ORAL | Status: DC
  Administered 2015-09-30 – 2015-10-03 (×4): 17 g via ORAL
  Filled 2015-09-30 (×4): qty 1

## 2015-09-30 MED ORDER — INSULIN ASPART 100 UNIT/ML ~~LOC~~ SOLN: 0.0000 [IU] | Freq: Three times a day (TID) | SUBCUTANEOUS | Status: DC
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
## 2015-09-30 NOTE — Progress Notes (Signed)
Pt transferred from [place] this evening at 1820hrs. Alert, oriented and able to voice needs. Left chest tube in place with no air leak noted. Pt condition stable

## 2015-09-30 NOTE — Clinical Documentation Improvement (Signed)
Trauma  Abnormal Lab/Test Results:   4/02:  Potassium: 2.8.  Possible Clinical Conditions associated with below indicators  Hypokalemia  Other Condition  Cannot Clinically Determine   Treatment Provided: 4/02:  Potassium chloride 10 meq in 100 ml IVPB. 4/03:  Potassium chloride 40 meq po.   Please exercise your independent, professional judgment when responding. A specific answer is not anticipated or expected.   Thank You,  [place] [phone]

## 2015-10-01 ENCOUNTER — Inpatient Hospital Stay (HOSPITAL_COMMUNITY): Payer: No Typology Code available for payment source

## 2015-10-01 DIAGNOSIS — R338 Other retention of urine: Secondary | ICD-10-CM | POA: Diagnosis present

## 2015-10-01 LAB — GLUCOSE, CAPILLARY
Glucose-Capillary: 137 mg/dL — ABNORMAL HIGH (ref 65–99)
Glucose-Capillary: 185 mg/dL — ABNORMAL HIGH (ref 65–99)
Glucose-Capillary: 158 mg/dL — ABNORMAL HIGH (ref 65–99)
Glucose-Capillary: 168 mg/dL — ABNORMAL HIGH (ref 65–99)

## 2015-10-01 MED ORDER — LEVALBUTEROL HCL 0.63 MG/3ML IN NEBU
0.6300 mg | INHALATION_SOLUTION | Freq: Three times a day (TID) | RESPIRATORY_TRACT | Status: DC
  Administered 2015-10-02 – 2015-10-03 (×3): 0.63 mg via RESPIRATORY_TRACT
  Filled 2015-10-01 (×4): qty 3

## 2015-10-01 NOTE — Progress Notes (Signed)
pt is sleeping comfortably at this time, neb not given. No distress noted.
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
## 2015-10-02 ENCOUNTER — Inpatient Hospital Stay (HOSPITAL_COMMUNITY): Payer: No Typology Code available for payment source

## 2015-10-02 ENCOUNTER — Encounter (HOSPITAL_COMMUNITY): Payer: Self-pay | Admitting: General Practice

## 2015-10-02 LAB — GLUCOSE, CAPILLARY
GLUCOSE-CAPILLARY: 135 mg/dL — AB (ref 65–99)
GLUCOSE-CAPILLARY: 111 mg/dL — AB (ref 65–99)
GLUCOSE-CAPILLARY: 98 mg/dL (ref 65–99)
Glucose-Capillary: 119 mg/dL — ABNORMAL HIGH (ref 65–99)

## 2015-10-02 MED ORDER — BETHANECHOL CHLORIDE 25 MG PO TABS
25.0000 mg | ORAL_TABLET | Freq: Three times a day (TID) | ORAL | Status: DC
  Administered 2015-10-02 (×3): 25 mg via ORAL
  Filled 2015-10-02 (×3): qty 1
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
## 2015-10-03 LAB — GLUCOSE, CAPILLARY
GLUCOSE-CAPILLARY: 108 mg/dL — AB (ref 65–99)
Glucose-Capillary: 123 mg/dL — ABNORMAL HIGH (ref 65–99)

## 2015-10-03 MED ORDER — METFORMIN HCL 500 MG PO TABS
500.0000 mg | ORAL_TABLET | Freq: Two times a day (BID) | ORAL | Status: DC
  Administered 2015-10-03: 500 mg via ORAL
  Filled 2015-10-03: qty 1

## 2015-10-03 MED ORDER — HYDROCHLOROTHIAZIDE 12.5 MG PO CAPS
12.5000 mg | ORAL_CAPSULE | Freq: Every day | ORAL | Status: DC
Start: 2015-10-03 — End: 2015-10-03
  Administered 2015-10-03: 12.5 mg via ORAL
  Filled 2015-10-03: qty 1

## 2015-10-03 MED ORDER — ASPIRIN EC 81 MG PO TBEC
81.0000 mg | DELAYED_RELEASE_TABLET | Freq: Every day | ORAL | Status: DC
  Administered 2015-10-03: 81 mg via ORAL
  Filled 2015-10-03: qty 1

## 2015-10-03 MED ORDER — MUPIROCIN 2 % EX OINT
TOPICAL_OINTMENT | CUTANEOUS | Status: AC
  Administered 2015-10-03: 14:00:00
  Filled 2015-10-03: qty 22

## 2015-10-03 MED ORDER — OXYCODONE-ACETAMINOPHEN 7.5-325 MG PO TABS: 1.0000 | ORAL_TABLET | ORAL | Status: DC | PRN

## 2015-10-03 MED ORDER — PRAVASTATIN SODIUM 10 MG PO TABS
20.0000 mg | ORAL_TABLET | Freq: Every day | ORAL | Status: DC
  Administered 2015-10-03: 20 mg via ORAL
  Filled 2015-10-03: qty 2

## 2015-10-03 MED ORDER — NAPROXEN 500 MG PO TABS: 500.0000 mg | ORAL_TABLET | Freq: Two times a day (BID) | ORAL | Status: DC

## 2015-10-03 MED ORDER — METHOCARBAMOL 500 MG PO TABS: 500.0000 mg | ORAL_TABLET | Freq: Four times a day (QID) | ORAL | Status: DC | PRN

## 2015-10-03 MED ORDER — BETHANECHOL CHLORIDE 10 MG PO TABS
10.0000 mg | ORAL_TABLET | Freq: Four times a day (QID) | ORAL | Status: DC
  Administered 2015-10-03: 10 mg via ORAL
  Filled 2015-10-03 (×3): qty 1

## 2015-10-03 MED ORDER — TAMSULOSIN HCL 0.4 MG PO CAPS: 0.4000 mg | ORAL_CAPSULE | Freq: Every day | ORAL | Status: DC
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
## 2015-10-03 NOTE — Care Management Note (Signed)
Case Management Note  Patient Details  Name: Dakota Patterson MRN: [id number] Date of Birth: 09/21/1960  Subjective/Objective:   Pt medically stable for dc home today with spouse.  Will need home oxygen, as sats drop in the low 80s with ambulation.                   Action/Plan: Pt qualifies for home oxygen with asthma diagnosis and low ambulatory sats.  Referral to [facility] for home oxygen set up.  Portable tank to be delivered to pt prior to dc home.  Pt is uninsured, but qualifies for medication assistance through [facility] program.  [facility] letter given with explanation of program benefits.    Expected Discharge Date: 10/03/2015         Expected Discharge Plan:  Home/Self Care  In-House Referral:     Discharge planning Services  CM Consult, Medication Assistance, [place] Program  Post Acute Care Choice:    Choice offered to:     DME Arranged:  Oxygen DME Agency:  [place]:    [facility] Agency:     Status of Service:  Completed, signed off  Medicare Important Message Given:    Date Medicare IM Given:    Medicare IM give by:    Date Additional Medicare IM Given:    Additional Medicare Important Message give by:     If discussed at [place] of Stay Meetings, dates discussed:    Additional Comments:  Reinaldo Raddle, RN, BSN  Trauma/Neuro ICU Case Manager [phone]

## 2015-10-03 NOTE — Progress Notes (Signed)
SATURATION QUALIFICATIONS: (This note is used to comply with regulatory documentation for home oxygen)  Patient Saturations on Room Air at Rest =88  Patient Saturations on Room Air while Ambulating = 85  Patient Saturations on 1 Liters of oxygen  after  Ambulating = 94  Please briefly explain why patient needs home oxygen: Patient desats while ambulating and at rest without O2

## 2015-10-03 NOTE — Progress Notes (Signed)
Dakota Patterson to be D/C'd  per MD order. Discussed with the patient and all questions fully answered.  VSS, Skin clean, dry and intact without evidence of skin break down, no evidence of skin tears noted.  IV catheter discontinued intact. Site without signs and symptoms of complications. Dressing and pressure applied.  An After Visit Summary was printed and given to the patient. Patient received prescription.  Instructed Wife on the dressing change  D/c education completed with patient/family including follow up instructions, medication list, d/c activities limitations if indicated, with other d/c instructions as indicated by MD - patient able to verbalize understanding, all questions fully answered.   Patient instructed to return to ED, call 911, or call MD for any changes in condition.   Patient to be escorted via [place], and D/C home via private auto.
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
## 2015-10-03 NOTE — Discharge Instructions (Signed)
Wash wounds daily in shower with soap and water. °Do not soak. °Apply antibiotic ointment (e.g. Neosporin) twice daily and as needed to keep moist. °Cover with dry dressing. ° °No driving while taking oxycodone. °

## 2015-10-04 ENCOUNTER — Encounter (HOSPITAL_COMMUNITY): Payer: Self-pay | Admitting: Emergency Medicine
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
## 2022-08-11 ENCOUNTER — Emergency Department (HOSPITAL_COMMUNITY): Payer: Medicaid Other

## 2022-08-11 ENCOUNTER — Inpatient Hospital Stay (HOSPITAL_COMMUNITY)
Admission: EM | Admit: 2022-08-11 | Discharge: 2022-08-14 | DRG: 193 | Disposition: A | Discharge status: Hospice | Payer: Medicaid Other | Attending: Internal Medicine | Admitting: Internal Medicine

## 2022-08-11 ENCOUNTER — Other Ambulatory Visit: Payer: Self-pay

## 2022-08-11 ENCOUNTER — Encounter (HOSPITAL_COMMUNITY): Payer: Self-pay | Admitting: *Deleted

## 2022-08-11 DIAGNOSIS — Z66 Do not resuscitate: Secondary | ICD-10-CM | POA: Diagnosis present

## 2022-08-11 DIAGNOSIS — J1000 Influenza due to other identified influenza virus with unspecified type of pneumonia: Principal | ICD-10-CM | POA: Diagnosis present

## 2022-08-11 DIAGNOSIS — Z888 Allergy status to other drugs, medicaments and biological substances status: Secondary | ICD-10-CM | POA: Diagnosis not present

## 2022-08-11 DIAGNOSIS — J09X1 Influenza due to identified novel influenza A virus with pneumonia: Secondary | ICD-10-CM | POA: Diagnosis not present

## 2022-08-11 DIAGNOSIS — R6520 Severe sepsis without septic shock: Secondary | ICD-10-CM

## 2022-08-11 DIAGNOSIS — J441 Chronic obstructive pulmonary disease with (acute) exacerbation: Secondary | ICD-10-CM | POA: Diagnosis present

## 2022-08-11 DIAGNOSIS — Z515 Encounter for palliative care: Secondary | ICD-10-CM

## 2022-08-11 DIAGNOSIS — Z7984 Long term (current) use of oral hypoglycemic drugs: Secondary | ICD-10-CM

## 2022-08-11 DIAGNOSIS — E7800 Pure hypercholesterolemia, unspecified: Secondary | ICD-10-CM | POA: Diagnosis present

## 2022-08-11 DIAGNOSIS — J9601 Acute respiratory failure with hypoxia: Secondary | ICD-10-CM | POA: Diagnosis present

## 2022-08-11 DIAGNOSIS — K219 Gastro-esophageal reflux disease without esophagitis: Secondary | ICD-10-CM | POA: Diagnosis present

## 2022-08-11 DIAGNOSIS — J189 Pneumonia, unspecified organism: Secondary | ICD-10-CM | POA: Diagnosis present

## 2022-08-11 DIAGNOSIS — Z794 Long term (current) use of insulin: Secondary | ICD-10-CM

## 2022-08-11 DIAGNOSIS — Z87891 Personal history of nicotine dependence: Secondary | ICD-10-CM

## 2022-08-11 DIAGNOSIS — F1020 Alcohol dependence, uncomplicated: Secondary | ICD-10-CM | POA: Diagnosis not present

## 2022-08-11 DIAGNOSIS — G4733 Obstructive sleep apnea (adult) (pediatric): Secondary | ICD-10-CM | POA: Diagnosis present

## 2022-08-11 DIAGNOSIS — J101 Influenza due to other identified influenza virus with other respiratory manifestations: Secondary | ICD-10-CM | POA: Diagnosis present

## 2022-08-11 DIAGNOSIS — E43 Unspecified severe protein-calorie malnutrition: Secondary | ICD-10-CM | POA: Insufficient documentation

## 2022-08-11 DIAGNOSIS — E119 Type 2 diabetes mellitus without complications: Secondary | ICD-10-CM | POA: Diagnosis present

## 2022-08-11 DIAGNOSIS — Z681 Body mass index (BMI) 19 or less, adult: Secondary | ICD-10-CM | POA: Diagnosis not present

## 2022-08-11 DIAGNOSIS — A419 Sepsis, unspecified organism: Secondary | ICD-10-CM

## 2022-08-11 DIAGNOSIS — Z7189 Other specified counseling: Secondary | ICD-10-CM | POA: Diagnosis not present

## 2022-08-11 DIAGNOSIS — J449 Chronic obstructive pulmonary disease, unspecified: Secondary | ICD-10-CM | POA: Diagnosis present

## 2022-08-11 DIAGNOSIS — I10 Essential (primary) hypertension: Secondary | ICD-10-CM | POA: Diagnosis present

## 2022-08-11 DIAGNOSIS — J439 Emphysema, unspecified: Secondary | ICD-10-CM | POA: Diagnosis present

## 2022-08-11 DIAGNOSIS — Z833 Family history of diabetes mellitus: Secondary | ICD-10-CM

## 2022-08-11 DIAGNOSIS — Z7952 Long term (current) use of systemic steroids: Secondary | ICD-10-CM

## 2022-08-11 DIAGNOSIS — Z7951 Long term (current) use of inhaled steroids: Secondary | ICD-10-CM

## 2022-08-11 DIAGNOSIS — Z79899 Other long term (current) drug therapy: Secondary | ICD-10-CM | POA: Diagnosis not present

## 2022-08-11 DIAGNOSIS — C3490 Malignant neoplasm of unspecified part of unspecified bronchus or lung: Secondary | ICD-10-CM | POA: Diagnosis present

## 2022-08-11 DIAGNOSIS — J440 Chronic obstructive pulmonary disease with acute lower respiratory infection: Secondary | ICD-10-CM | POA: Diagnosis present

## 2022-08-11 DIAGNOSIS — Z1152 Encounter for screening for COVID-19: Secondary | ICD-10-CM

## 2022-08-11 HISTORY — DX: Malignant neoplasm of unspecified part of unspecified bronchus or lung: C34.90

## 2022-08-11 LAB — CBC WITH DIFFERENTIAL/PLATELET
Abs Immature Granulocytes: 0.19 10*3/uL — ABNORMAL HIGH (ref 0.00–0.07)
Basophils Absolute: 0.1 10*3/uL (ref 0.0–0.1)
Basophils Relative: 1 %
Eosinophils Absolute: 0.5 10*3/uL (ref 0.0–0.5)
Eosinophils Relative: 3 %
HCT: 42.5 % (ref 39.0–52.0)
Hemoglobin: 14 g/dL (ref 13.0–17.0)
Immature Granulocytes: 1 %
Lymphocytes Relative: 18 %
Lymphs Abs: 2.6 10*3/uL (ref 0.7–4.0)
MCH: 35.4 pg — ABNORMAL HIGH (ref 26.0–34.0)
MCHC: 32.9 g/dL (ref 30.0–36.0)
MCV: 107.3 fL — ABNORMAL HIGH (ref 80.0–100.0)
Monocytes Absolute: 1.3 10*3/uL — ABNORMAL HIGH (ref 0.1–1.0)
Monocytes Relative: 9 %
Neutro Abs: 9.5 10*3/uL — ABNORMAL HIGH (ref 1.7–7.7)
Neutrophils Relative %: 68 %
Platelets: 450 10*3/uL — ABNORMAL HIGH (ref 150–400)
RBC: 3.96 MIL/uL — ABNORMAL LOW (ref 4.22–5.81)
RDW: 12.8 % (ref 11.5–15.5)
WBC: 14.1 10*3/uL — ABNORMAL HIGH (ref 4.0–10.5)
nRBC: 0 % (ref 0.0–0.2)

## 2022-08-11 LAB — URINALYSIS, ROUTINE W REFLEX MICROSCOPIC
Bilirubin Urine: NEGATIVE
Glucose, UA: NEGATIVE mg/dL
Ketones, ur: NEGATIVE mg/dL
Nitrite: NEGATIVE
Protein, ur: NEGATIVE mg/dL
Specific Gravity, Urine: >1.046 — ABNORMAL HIGH (ref 1.005–1.030)
pH: 5 (ref 5.0–8.0)

## 2022-08-11 LAB — COMPREHENSIVE METABOLIC PANEL
ALT: 24 U/L (ref 0–44)
AST: 46 U/L — ABNORMAL HIGH (ref 15–41)
Albumin: 3.3 g/dL — ABNORMAL LOW (ref 3.5–5.0)
Alkaline Phosphatase: 89 U/L (ref 38–126)
Anion gap: 15 (ref 5–15)
BUN: 19 mg/dL (ref 8–23)
CO2: 24 mmol/L (ref 22–32)
Calcium: 9.4 mg/dL (ref 8.9–10.3)
Chloride: 98 mmol/L (ref 98–111)
Creatinine, Ser: 1.06 mg/dL (ref 0.61–1.24)
GFR, Estimated: >60 mL/min (ref >60)
Glucose, Bld: 173 mg/dL — ABNORMAL HIGH (ref 70–99)
Potassium: 4.2 mmol/L (ref 3.5–5.1)
Sodium: 137 mmol/L (ref 135–145)
Total Bilirubin: 0.6 mg/dL (ref 0.3–1.2)
Total Protein: 8.3 g/dL — ABNORMAL HIGH (ref 6.5–8.1)

## 2022-08-11 LAB — RESP PANEL BY RT-PCR (RSV, FLU A&B, COVID)  RVPGX2
Influenza A by PCR: NEGATIVE
Influenza B by PCR: POSITIVE — AB
Resp Syncytial Virus by PCR: NEGATIVE
SARS Coronavirus 2 by RT PCR: NEGATIVE

## 2022-08-11 LAB — PHOSPHORUS: Phosphorus: 3.9 mg/dL (ref 2.5–4.6)

## 2022-08-11 LAB — LACTIC ACID, PLASMA
Lactic Acid, Venous: 2.5 mmol/L (ref 0.5–1.9)
Lactic Acid, Venous: 1.6 mmol/L (ref 0.5–1.9)

## 2022-08-11 LAB — MAGNESIUM: Magnesium: 1.6 mg/dL — ABNORMAL LOW (ref 1.7–2.4)

## 2022-08-11 LAB — HEMOGLOBIN A1C
Hgb A1c MFr Bld: 6.2 % — ABNORMAL HIGH (ref 4.8–5.6)
Mean Plasma Glucose: 131.24 mg/dL

## 2022-08-11 LAB — GLUCOSE, CAPILLARY: Glucose-Capillary: 286 mg/dL — ABNORMAL HIGH (ref 70–99)

## 2022-08-11 LAB — PROCALCITONIN: Procalcitonin: 0.63 ng/mL

## 2022-08-11 LAB — PROTIME-INR
INR: 1 (ref 0.8–1.2)
Prothrombin Time: 13 seconds (ref 11.4–15.2)

## 2022-08-11 MED ORDER — POLYETHYLENE GLYCOL 3350 17 G PO PACK: 17.0000 g | PACK | Freq: Every day | ORAL | Status: DC | PRN

## 2022-08-11 MED ORDER — TIOTROPIUM BROMIDE MONOHYDRATE 2.5 MCG/ACT IN AERS: 2.0000 | INHALATION_SPRAY | Freq: Every day | RESPIRATORY_TRACT | Status: DC

## 2022-08-11 MED ORDER — FOLIC ACID 1 MG PO TABS
1.0000 mg | ORAL_TABLET | Freq: Every day | ORAL | Status: DC
  Administered 2022-08-11 – 2022-08-14 (×4): 1 mg via ORAL
  Filled 2022-08-11 (×4): qty 1

## 2022-08-11 MED ORDER — ADULT MULTIVITAMIN W/MINERALS CH
1.0000 | ORAL_TABLET | Freq: Every day | ORAL | Status: DC
  Administered 2022-08-11 – 2022-08-14 (×4): 1 via ORAL
  Filled 2022-08-11 (×4): qty 1

## 2022-08-11 MED ORDER — LACTATED RINGERS IV SOLN: INTRAVENOUS | Status: DC

## 2022-08-11 MED ORDER — ALBUTEROL SULFATE HFA 108 (90 BASE) MCG/ACT IN AERS: 2.0000 | INHALATION_SPRAY | RESPIRATORY_TRACT | Status: DC | PRN

## 2022-08-11 MED ORDER — SODIUM CHLORIDE 0.9 % IV BOLUS
1000.0000 mL | Freq: Once | INTRAVENOUS | Status: AC
  Administered 2022-08-11: 1000 mL via INTRAVENOUS

## 2022-08-11 MED ORDER — METHYLPREDNISOLONE SODIUM SUCC 125 MG IJ SOLR
125.0000 mg | Freq: Once | INTRAMUSCULAR | Status: AC
  Administered 2022-08-11: 125 mg via INTRAVENOUS
  Filled 2022-08-11: qty 2

## 2022-08-11 MED ORDER — LORAZEPAM 1 MG PO TABS
1.0000 mg | ORAL_TABLET | ORAL | Status: DC | PRN
  Administered 2022-08-11 – 2022-08-12 (×2): 1 mg via ORAL
  Filled 2022-08-11 (×2): qty 1

## 2022-08-11 MED ORDER — ENOXAPARIN SODIUM 30 MG/0.3ML IJ SOSY
30.0000 mg | PREFILLED_SYRINGE | INTRAMUSCULAR | Status: DC
  Administered 2022-08-11 – 2022-08-13 (×3): 30 mg via SUBCUTANEOUS
  Filled 2022-08-11 (×3): qty 0.3

## 2022-08-11 MED ORDER — ONDANSETRON HCL 4 MG/2ML IJ SOLN: 4.0000 mg | Freq: Four times a day (QID) | INTRAMUSCULAR | Status: DC | PRN

## 2022-08-11 MED ORDER — ACETAMINOPHEN 325 MG PO TABS
650.0000 mg | ORAL_TABLET | Freq: Four times a day (QID) | ORAL | Status: DC | PRN
  Administered 2022-08-11 – 2022-08-12 (×2): 650 mg via ORAL
  Filled 2022-08-11 (×2): qty 2

## 2022-08-11 MED ORDER — METOPROLOL TARTRATE 25 MG PO TABS
25.0000 mg | ORAL_TABLET | Freq: Two times a day (BID) | ORAL | Status: DC
  Administered 2022-08-11 – 2022-08-14 (×6): 25 mg via ORAL
  Filled 2022-08-11 (×6): qty 1

## 2022-08-11 MED ORDER — SODIUM CHLORIDE 0.9 % IV SOLN
500.0000 mg | INTRAVENOUS | Status: DC
  Administered 2022-08-11 – 2022-08-13 (×3): 500 mg via INTRAVENOUS
  Filled 2022-08-11 (×3): qty 5

## 2022-08-11 MED ORDER — ONDANSETRON HCL 4 MG PO TABS: 4.0000 mg | ORAL_TABLET | Freq: Four times a day (QID) | ORAL | Status: DC | PRN

## 2022-08-11 MED ORDER — GUAIFENESIN-DM 100-10 MG/5ML PO SYRP
15.0000 mL | ORAL_SOLUTION | Freq: Four times a day (QID) | ORAL | Status: DC | PRN
  Administered 2022-08-11 – 2022-08-12 (×2): 15 mL via ORAL
  Filled 2022-08-11 (×2): qty 15

## 2022-08-11 MED ORDER — MOMETASONE FURO-FORMOTEROL FUM 200-5 MCG/ACT IN AERO
2.0000 | INHALATION_SPRAY | Freq: Two times a day (BID) | RESPIRATORY_TRACT | Status: DC
  Administered 2022-08-11 – 2022-08-13 (×4): 2 via RESPIRATORY_TRACT
  Filled 2022-08-11: qty 8.8

## 2022-08-11 MED ORDER — IOHEXOL 350 MG/ML SOLN
75.0000 mL | Freq: Once | INTRAVENOUS | Status: AC | PRN
  Administered 2022-08-11: 75 mL via INTRAVENOUS

## 2022-08-11 MED ORDER — ALBUTEROL SULFATE (2.5 MG/3ML) 0.083% IN NEBU
2.5000 mg | INHALATION_SOLUTION | RESPIRATORY_TRACT | Status: DC | PRN
  Administered 2022-08-11: 2.5 mg via RESPIRATORY_TRACT
  Filled 2022-08-11 (×2): qty 3

## 2022-08-11 MED ORDER — SODIUM CHLORIDE 0.9 % IV SOLN
2.0000 g | INTRAVENOUS | Status: DC
  Administered 2022-08-11 – 2022-08-13 (×3): 2 g via INTRAVENOUS
  Filled 2022-08-11 (×3): qty 20

## 2022-08-11 MED ORDER — ALBUTEROL (5 MG/ML) CONTINUOUS INHALATION SOLN: 10.0000 mg | INHALATION_SOLUTION | RESPIRATORY_TRACT | Status: AC

## 2022-08-11 MED ORDER — INSULIN ASPART 100 UNIT/ML IJ SOLN
0.0000 [IU] | Freq: Every day | INTRAMUSCULAR | Status: DC
  Administered 2022-08-11 – 2022-08-13 (×2): 3 [IU] via SUBCUTANEOUS

## 2022-08-11 MED ORDER — ACETAMINOPHEN 650 MG RE SUPP: 650.0000 mg | Freq: Four times a day (QID) | RECTAL | Status: DC | PRN

## 2022-08-11 MED ORDER — THIAMINE HCL 100 MG/ML IJ SOLN
100.0000 mg | Freq: Every day | INTRAMUSCULAR | Status: DC
  Filled 2022-08-11: qty 2

## 2022-08-11 MED ORDER — ALBUTEROL SULFATE (2.5 MG/3ML) 0.083% IN NEBU
2.5000 mg | INHALATION_SOLUTION | Freq: Four times a day (QID) | RESPIRATORY_TRACT | Status: DC
  Administered 2022-08-12 – 2022-08-13 (×5): 2.5 mg via RESPIRATORY_TRACT
  Filled 2022-08-11 (×4): qty 3

## 2022-08-11 MED ORDER — SODIUM CHLORIDE 0.9 % IV SOLN: INTRAVENOUS | Status: AC

## 2022-08-11 MED ORDER — INSULIN ASPART 100 UNIT/ML IJ SOLN
0.0000 [IU] | Freq: Three times a day (TID) | INTRAMUSCULAR | Status: DC
  Administered 2022-08-12 (×3): 3 [IU] via SUBCUTANEOUS
  Administered 2022-08-13: 2 [IU] via SUBCUTANEOUS
  Administered 2022-08-13 – 2022-08-14 (×2): 3 [IU] via SUBCUTANEOUS

## 2022-08-11 MED ORDER — THIAMINE MONONITRATE 100 MG PO TABS
100.0000 mg | ORAL_TABLET | Freq: Every day | ORAL | Status: DC
  Administered 2022-08-11 – 2022-08-14 (×4): 100 mg via ORAL
  Filled 2022-08-11 (×4): qty 1

## 2022-08-11 MED ORDER — LACTATED RINGERS IV BOLUS
1000.0000 mL | Freq: Once | INTRAVENOUS | Status: AC
  Administered 2022-08-11: 1000 mL via INTRAVENOUS

## 2022-08-11 MED ORDER — LORAZEPAM 1 MG PO TABS: 1.0000 mg | ORAL_TABLET | ORAL | Status: DC | PRN

## 2022-08-11 MED ORDER — ALBUTEROL SULFATE (2.5 MG/3ML) 0.083% IN NEBU: 2.5000 mg | INHALATION_SOLUTION | RESPIRATORY_TRACT | Status: DC | PRN

## 2022-08-11 MED ORDER — OSELTAMIVIR PHOSPHATE 75 MG PO CAPS
75.0000 mg | ORAL_CAPSULE | Freq: Every day | ORAL | Status: DC
  Administered 2022-08-11 – 2022-08-12 (×2): 75 mg via ORAL
  Filled 2022-08-11 (×2): qty 1

## 2022-08-11 MED ORDER — UMECLIDINIUM BROMIDE 62.5 MCG/ACT IN AEPB
1.0000 | INHALATION_SPRAY | Freq: Every day | RESPIRATORY_TRACT | Status: DC
  Administered 2022-08-12 – 2022-08-13 (×2): 1 via RESPIRATORY_TRACT
  Filled 2022-08-11: qty 7

## 2022-08-11 NOTE — Progress Notes (Signed)
Notified bedside nurse of need to draw repeat lactic acid. 

## 2022-08-11 NOTE — Assessment & Plan Note (Signed)
Stable. -Resume metoprolol 

## 2022-08-11 NOTE — Assessment & Plan Note (Addendum)
Follows with hematology at [facility], per last notes 05/2022, history of  NSCLC-stage IV.  He was not interested in coming to campus q3 weeks, risking side effects/reduced quality of life.  Palliative care versus hospice was discussed.  Plan was for supportive management.  Chronic RL abd pain-  Recent abdominal CT 05/2022 shows new hypoattenuation nodule in the right adrenal gland worrisome for potential metastasis, also potential site of metastasis -ill-defined hypodensity in right gluteus musculature. - He was to be evaluated by hospice today.

## 2022-08-11 NOTE — Progress Notes (Signed)
Elink following code sepsis °

## 2022-08-11 NOTE — ED Triage Notes (Signed)
Pt with stage 4 lung cancer is brought in by family from home due to decreasing spo2 at home for since Friday.  Pt has home O2 available to use prn and he has been on it since Friday as his spo2 was dipping to 78% on RA.  Today his sob increased further and family had to bump his [place] to 4L.  Pt has been having cough and congestion.  No fever at home, no sick contacts.  Pt is not getting cancer treatment at this time and hospice has been called today but has not begun care.  Pt has been mostly sleeping since Friday and has had poor po intake.  Video appointment with PCP this am who advised to come to ED>  Pt is alert and oriented.

## 2022-08-11 NOTE — ED Provider Notes (Signed)
Performed screening exam for the patient.  62 year old male with a history of tobacco use, COPD, advanced lung cancer presents to the emergency department with shortness of breath.  Patient reports that he has had recent progressive shortness of breath and chest discomfort.  Was placed on hospice today.  Started to have increasing oxygen requirement recently and so was referred into the emergency department for evaluation.  Patient tachycardic into the 130s on my evaluation.  Does have rhonchi bilaterally.  Labs have been ordered for sepsis evaluation though he is afebrile at this time and have also ordered a chest x-ray and CTA of the chest.  Please see additional provider note for full H&P and plan.   Fransico Meadow, MD 08/11/22 [phone]

## 2022-08-11 NOTE — Progress Notes (Signed)
   08/11/22 1742  Assess: MEWS Score  Temp 98.6 F (37 C)  BP 128/83  MAP (mmHg) 97  Pulse Rate (!) 119  Resp 20  SpO2 100 %  O2 Device Nasal Cannula  O2 Flow Rate (L/min) 3 L/min  Assess: MEWS Score  MEWS Temp 0  MEWS Systolic 0  MEWS Pulse 2  MEWS RR 0  MEWS LOC 0  MEWS Score 2  MEWS Score Color Yellow  Assess: if the MEWS score is Yellow or Red  Were vital signs taken at a resting state? Yes  Focused Assessment No change from prior assessment  MEWS guidelines implemented  No, previously yellow, continue vital signs every 4 hours  Notify: Charge Nurse/RN  Name of Charge Nurse/RN Notified Crystal, RN  Provider Notification  Provider Name/Title MD [place]  Date Provider Notified 08/11/22  Time Provider Notified 1806  Assess: SIRS CRITERIA  SIRS Temperature  0  SIRS Pulse 1  SIRS Respirations  0  SIRS WBC 1  SIRS Score Sum  2

## 2022-08-11 NOTE — Assessment & Plan Note (Signed)
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  Last drink was Saturday 2/10.  No history of withdrawals or seizures. Currently calm, no sign of withdrawal. -CIWA as needed -Thiamine, Foley, multivitamins -Magnesium, phosphorus

## 2022-08-11 NOTE — ED Provider Notes (Signed)
Dakota Patterson Provider Note   CSN: [id number] Arrival date & time: 08/11/22  1419     History {Add pertinent medical, surgical, social history, OB history to HPI:1} Chief Complaint  Patient presents with   Shortness of Breath    Dakota Patterson is a 62 y.o. male.   Shortness of Breath  Patient is a 62 year old male with a history of COPD emphysema as well as a history of recently diagnosed stage IV metastatic lung cancer.  Currently not undergoing any treatment but under the care of oncology at [facility].  Presents with shortness of breath ongoing for 1 week.  Patient denies vomiting but has had increasing coughing and weakness and states he cannot walk across the room without becoming severely dyspneic.  He has never had a blood clot, states he is compliant with his medications.  Symptoms are persistent and severe, brought in by family on oxygen    Home Medications Prior to Admission medications   Medication Sig Start Date End Date Taking? Authorizing Provider  acetaminophen (TYLENOL) 500 MG tablet Take 1,000 mg by mouth 2 (two) times daily.   Yes [provider]  albuterol (PROVENTIL) (2.5 MG/3ML) 0.083% nebulizer solution Take 2.5 mg by nebulization every 8 (eight) hours as needed for wheezing or shortness of breath.   Yes [provider]  b complex vitamins capsule Take 1 capsule by mouth daily. 08/24/20  Yes [provider]  clobetasol cream (TEMOVATE) 8.58 % Apply 1 Application topically 2 (two) times daily.   Yes [provider]  cyanocobalamin (VITAMIN B12) 500 MCG tablet Take 1,000 mcg by mouth daily.   Yes [provider]  ferrous gluconate (FERGON) 324 MG tablet 1 tablet Orally daily   Yes [provider]  folic acid (FOLVITE) 1 MG tablet Take 1 mg by mouth daily. 08/24/20  Yes [provider]  insulin glargine (LANTUS SOLOSTAR) 100 UNIT/ML Solostar Pen Inject 10 Units  into the skin every evening. 10/09/21  Yes [provider]  MAGNESIUM-OXIDE 400 (240 Mg) MG tablet Take 1 tablet by mouth daily. 07/12/22  Yes [provider]  metFORMIN (GLUCOPHAGE) 500 MG tablet Take 500 mg by mouth 2 (two) times daily with a meal.   Yes [provider]  metoprolol tartrate (LOPRESSOR) 12.5 mg TABS tablet Take 0.5 tablets (12.5 mg total) by mouth 2 (two) times daily. Patient taking differently: Take 1 tablet by mouth 2 (two) times daily. 11/04/13  Yes Barton Dubois, MD  pravastatin (PRAVACHOL) 20 MG tablet Take 20 mg by mouth daily.   Yes [provider]  SYMBICORT 160-4.5 MCG/ACT inhaler Inhale 2 puffs into the lungs 2 (two) times daily. 07/07/22  Yes [provider]  Tiotropium Bromide Monohydrate (SPIRIVA RESPIMAT) 2.5 MCG/ACT AERS Inhale 2 each into the lungs daily.   Yes [provider]  Tiotropium Bromide Monohydrate 2.5 MCG/ACT AERS Inhale into the lungs. 03/20/22 03/20/23 Yes [provider]  prochlorperazine (COMPAZINE) 10 MG tablet Take by mouth. Patient not taking: Reported on 08/11/2022 03/17/22   [provider]  triamcinolone lotion (KENALOG) 0.1 % 1 application    [provider]      Allergies    Lisinopril    Review of Systems   Review of Systems  Respiratory:  Positive for shortness of breath.   All other systems reviewed and are negative.   Physical Exam Updated Vital Signs BP (!) 146/81   Pulse (!) 126   Temp  99.1 F (37.3 C) (Oral)   Resp (!) 22   Ht 1.6 m (5\' 3" )   Wt 49.4 kg   SpO2 100%   BMI 19.31 kg/m  Physical Exam Vitals and nursing note reviewed.  Constitutional:      General: He is in acute distress.     Appearance: He is well-developed. He is ill-appearing.  HENT:     Head: Normocephalic and atraumatic.     Mouth/Throat:     Pharynx: No oropharyngeal exudate.  Eyes:     General: No scleral icterus.       Right eye: No discharge.        Left eye: No  discharge.     Conjunctiva/sclera: Conjunctivae normal.     Pupils: Pupils are equal, round, and reactive to light.  Neck:     Thyroid: No thyromegaly.     Vascular: No JVD.  Cardiovascular:     Rate and Rhythm: Regular rhythm. Tachycardia present.     Heart sounds: Normal heart sounds. No murmur heard.    No friction rub. No gallop.  Pulmonary:     Effort: Respiratory distress present.     Breath sounds: Wheezing present. No rales.     Comments: Tachypneic, prolonged expiratory phase, decreased lung sounds on the right, wheezing on the left, percussing the chest seems symmetrically similar Abdominal:     General: Bowel sounds are normal. There is no distension.     Palpations: Abdomen is soft. There is no mass.     Tenderness: There is no abdominal tenderness.  Musculoskeletal:        General: No tenderness. Normal range of motion.     Cervical back: Normal range of motion and neck supple.     Right lower leg: No tenderness.     Left lower leg: No tenderness.  Lymphadenopathy:     Cervical: No cervical adenopathy.  Skin:    General: Skin is warm and dry.     Findings: No erythema or rash.     Comments: Mottled appearing skin  Neurological:     General: No focal deficit present.     Mental Status: He is alert.     Coordination: Coordination normal.  Psychiatric:        Behavior: Behavior normal.     ED Results / Procedures / Treatments   Labs (all labs ordered are listed, but only abnormal results are displayed) Labs Reviewed  COMPREHENSIVE METABOLIC PANEL - Abnormal; Notable for the following components:      Result Value   Glucose, Bld 173 (*)    Total Protein 8.3 (*)    Albumin 3.3 (*)    AST 46 (*)    All other components within normal limits  LACTIC ACID, PLASMA - Abnormal; Notable for the following components:   Lactic Acid, Venous 2.5 (*)    All other components within normal limits  CBC WITH DIFFERENTIAL/PLATELET - Abnormal; Notable for the following  components:   WBC 14.1 (*)    RBC 3.96 (*)    MCV 107.3 (*)    MCH 35.4 (*)    Platelets 450 (*)    Neutro Abs 9.5 (*)    Monocytes Absolute 1.3 (*)    Abs Immature Granulocytes 0.19 (*)    All other components within normal limits  CULTURE, BLOOD (ROUTINE X 2)  CULTURE, BLOOD (ROUTINE X 2)  RESP PANEL BY RT-PCR (RSV, FLU A&B, COVID)  RVPGX2  PROTIME-INR  LACTIC ACID, PLASMA  URINALYSIS,  ROUTINE W REFLEX MICROSCOPIC    EKG EKG Interpretation  Date/Time:  Tuesday August 11 2022 14:34:31 EST Ventricular Rate:  142 PR Interval:  112 QRS Duration: 84 QT Interval:  289 QTC Calculation: 445 R Axis:   86 Text Interpretation: Sinus tachycardia Consider right atrial enlargement Borderline right axis deviation Repol abnrm suggests ischemia, lateral leads Confirmed by Margaretmary Eddy [phone]) on 08/11/2022 2:46:59 PM  Radiology CT Angio Chest PE W and/or Wo Contrast  Result Date: 08/11/2022 CLINICAL DATA:  High probability pulmonary embolism. EXAM: CT ANGIOGRAPHY CHEST WITH CONTRAST TECHNIQUE: Multidetector CT imaging of the chest was performed using the standard protocol during bolus administration of intravenous contrast. Multiplanar CT image reconstructions and MIPs were obtained to evaluate the vascular anatomy. RADIATION DOSE REDUCTION: This exam was performed according to the departmental dose-optimization program which includes automated exposure control, adjustment of the mA and/or kV according to patient size and/or use of iterative reconstruction technique. CONTRAST:  54mL OMNIPAQUE IOHEXOL 350 MG/ML SOLN COMPARISON:  None Available. FINDINGS: Cardiovascular: No filling defects within the pulmonary arteries to suggest acute pulmonary embolism. Coronary artery calcification and aortic atherosclerotic calcification. Mediastinum/Nodes: No mediastinal lymphadenopathy. Peribronchial thickening surrounding LEFT lower lobe bronchus extending into the LEFT lower lobe. Thickening measures up  to 2.5 x 1.8 cm (image 51/6. Coronal imaging there is spiculation associated with this peribronchial thickening as well as narrowing of the LEFT lower lobe pulmonary artery lumen (image 79/9). The peripheral nodular consolidation in the RIGHT middle lobe and RIGHT lower lobe. Example consolidated nodule measuring 1.2 cm on image 106/18. Ill-defined consolidation in the RIGHT middle lobe on image 109/8. A discrete RIGHT lower lobe nodule measuring 7 mm on image 108/8) Lungs/Pleura: Upper Abdomen: There is thickening of the LEFT adrenal gland scratch the there is thickening of the RIGHT adrenal gland 21.7 cm. RIGHT adrenal gland has soft tissue attenuation (HU equal 36). The liver has a nodular contour. Musculoskeletal: No aggressive osseous lesion. Review of the MIP images confirms the above findings. IMPRESSION: 1. No evidence acute pulmonary embolism. 2. Peribronchial thickening surrounding the LEFT lower lobe bronchus. Spiculated margin of the soft tissue thickening. Recommend FDG PET scan to evaluate for bronchogenic carcinoma. 3. Nodular consolidation in the RIGHT middle lobe and RIGHT lower lobe is concerning for multifocal pneumonia. 4. Nodular thickening of the RIGHT adrenal gland is indeterminate. Recommend characterization on above recommended FDG PET scan. 5. Nodular contour of the liver suggests cirrhosis. 6. Aortic atherosclerosis. Aortic Atherosclerosis (ICD10-I70.0). Electronically Signed   By: Suzy Bouchard M.D.   On: 08/11/2022 16:04   DG Chest Port 1 View  Result Date: 08/11/2022 CLINICAL DATA:  Shortness of breath, hypoxia, cancer EXAM: PORTABLE CHEST 1 VIEW COMPARISON:  Radiograph 10/02/2015 FINDINGS: Unchanged cardiomediastinal silhouette. Small nodular opacities in the right peripheral lower lung, new since April 2017. No large effusion or evidence of pneumothorax. Multiple chronic left upper rib fractures. Thoracic spondylosis. IMPRESSION: Small nodular opacities in the right peripheral  lower lung new since April 2017, could reflect an infectious/inflammatory process or possibly related to known malignancy. Electronically Signed   By: Maurine Simmering M.D.   On: 08/11/2022 15:06    Procedures .Critical Care  Performed by: Noemi Chapel, MD Authorized by: Noemi Chapel, MD   Critical care provider statement:    Critical care time (minutes):  45   Critical care time was exclusive of:  Separately billable procedures and treating other patients and teaching time   Critical care was necessary to treat or prevent  imminent or life-threatening deterioration of the following conditions:  Sepsis and respiratory failure   Critical care was time spent personally by me on the following activities:  Development of treatment plan with patient or surrogate, discussions with consultants, evaluation of patient's response to treatment, examination of patient, obtaining history from patient or surrogate, review of old charts, re-evaluation of patient's condition, pulse oximetry, ordering and review of radiographic studies, ordering and review of laboratory studies and ordering and performing treatments and interventions   I assumed direction of critical care for this patient from another provider in my specialty: no     Care discussed with: admitting provider   Comments:         {Document cardiac monitor, telemetry assessment procedure when appropriate:1}  Medications Ordered in ED Medications  albuterol (VENTOLIN HFA) 108 (90 Base) MCG/ACT inhaler 2 puff (has no administration in time range)  albuterol (PROVENTIL,VENTOLIN) solution continuous neb (10 mg Nebulization Not Given 08/11/22 1530)  sodium chloride 0.9 % bolus 1,000 mL (has no administration in time range)  lactated ringers infusion (has no administration in time range)  cefTRIAXone (ROCEPHIN) 2 g in sodium chloride 0.9 % 100 mL IVPB (2 g Intravenous New Bag/Given 08/11/22 1604)  azithromycin (ZITHROMAX) 500 mg in sodium chloride 0.9 %  250 mL IVPB (has no administration in time range)  sodium chloride 0.9 % bolus 1,000 mL (has no administration in time range)  lactated ringers bolus 1,000 mL (1,000 mLs Intravenous New Bag/Given 08/11/22 1527)  methylPREDNISolone sodium succinate (SOLU-MEDROL) 125 mg/2 mL injection 125 mg (125 mg Intravenous Given 08/11/22 1527)  iohexol (OMNIPAQUE) 350 MG/ML injection 75 mL (75 mLs Intravenous Contrast Given 08/11/22 1550)    ED Course/ Medical Decision Making/ A&P   {   Click here for ABCD2, HEART and other calculatorsREFRESH Note before signing :1}                          Medical Decision Making Amount and/or Complexity of Data Reviewed Labs: ordered.  Risk Prescription drug management.   This patient presents to the ED for concern of increased shortness of breath and respiratory distress, this involves an extensive number of treatment options, and is a complaint that carries with it a high risk of complications and morbidity.  The differential diagnosis includes metastatic lung cancer, pneumonia, sepsis, pneumothorax (the patient has a history of a prior pneumothorax on the left after trauma), would consider effusion, this could very well be pulmonary embolism given the associated tachycardia with a pulse of 130.   Co morbidities that complicate the patient evaluation  Lung cancer, COPD   Additional history obtained:  Additional history obtained from electronic medical record External records from outside source obtained and reviewed including outside oncology notes   Lab Tests:  I Ordered, and personally interpreted labs.  The pertinent results include: Leukocytosis, lactic acid of 2.5   Imaging Studies ordered:  I ordered imaging studies including chest x-ray as well as CT scan of the chest I independently visualized and interpreted imaging which showed likely pneumonia multifocal, no PE I agree with the radiologist interpretation   Cardiac Monitoring: / EKG:  The  patient was maintained on a cardiac monitor.  I personally viewed and interpreted the cardiac monitored which showed an underlying rhythm of: Tachycardia gradually improving   Consultations Obtained:  I requested consultation with the hospitalist,  and discussed lab and imaging findings as well as pertinent plan - they recommend: Admission  Problem List / ED Course / Critical interventions / Medication management  This patient is critically ill but also has severe metastatic cancer currently entering hospice but with signs and symptoms of sepsis.  After interpreting the labs there does not appear to be any significant endorgan dysfunction that I can see.  The patient does have a leukocytosis of 14,000, confirmatory CT scan showing multifocal pneumonia I ordered medication including Rocephin and Zithromax and IV fluids for pulmonary sepsis Reevaluation of the patient after these medicines showed that the patient critically ill I have reviewed the patients home medicines and have made adjustments as needed   Social Determinants of Health:  End-stage lung cancer   Test / Admission - Considered:  Will admit to the hospital   {Document critical care time when appropriate:1} {Document review of labs and clinical decision tools ie heart score, Chads2Vasc2 etc:1}  {Document your independent review of radiology images, and any outside records:1} {Document your discussion with family members, caretakers, and with consultants:1} {Document social determinants of health affecting pt's care:1} {Document your decision making why or why not admission, treatments were needed:1} Final Clinical Impression(s) / ED Diagnoses Final diagnoses:  None    Rx / DC Orders ED Discharge Orders     None

## 2022-08-11 NOTE — Assessment & Plan Note (Addendum)
Dyspnea, cough.  O2 sats > 95% on room air on my evaluation.  CTA chest-suggest multifocal pneumonia- RML, RLL.  Likely left lower lobe bronchus bronchogenic carcinoma.  Influenza positive.  Likely bacterial coinfection considering elevated procalcitonin at 0.63, so meeting severe sepsis criteria with tachycardia heart rate 120s to 140s, leukocytosis of 14.1, lactic acidosis of 2.5 -Continue IV ceftriaxone and azithromycin -3 L bolus given, continue N/s 100c/hr x 15hrs -Follow-up blood cultures

## 2022-08-11 NOTE — Assessment & Plan Note (Addendum)
COPD, and OSA on CPAP.  Very faint and sparse wheezing on exam. -125 mg Solu-Medrol given in ED, hold off on further steroids - PRN Mucolytics -Resume home bronchodilator regimen

## 2022-08-11 NOTE — Progress Notes (Signed)
Pt states he had tried to wear CPAP and does not wear it because can't get a seal RT asked if he was in agreement to try one tonight, patient states "I'm not going to wear so it's a waste of time". Nurse informed

## 2022-08-11 NOTE — Assessment & Plan Note (Signed)
-   SSI- M - Hold Home Lantus 10u - Hgba1c
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
## 2022-08-11 NOTE — Progress Notes (Signed)
Pt arrived to room 320 via bed from ED. Pt able to ambulate from ED bed to his bed with standby assist and with noted dyspnea on exertion. Pt is on 2lpm [place]. IVF infusing without s/s infiltration. Pt and family member oriented to room and safety precautions, state understanding.

## 2022-08-12 DIAGNOSIS — J09X1 Influenza due to identified novel influenza A virus with pneumonia: Secondary | ICD-10-CM | POA: Diagnosis not present

## 2022-08-12 LAB — GLUCOSE, CAPILLARY
Glucose-Capillary: 152 mg/dL — ABNORMAL HIGH (ref 70–99)
Glucose-Capillary: 166 mg/dL — ABNORMAL HIGH (ref 70–99)
Glucose-Capillary: 164 mg/dL — ABNORMAL HIGH (ref 70–99)
Glucose-Capillary: 99 mg/dL (ref 70–99)

## 2022-08-12 LAB — BLOOD CULTURE ID PANEL (REFLEXED) - BCID2

## 2022-08-12 LAB — BASIC METABOLIC PANEL
Anion gap: 8 (ref 5–15)
BUN: 16 mg/dL (ref 8–23)
CO2: 21 mmol/L — ABNORMAL LOW (ref 22–32)
Calcium: 7.7 mg/dL — ABNORMAL LOW (ref 8.9–10.3)
Chloride: 109 mmol/L (ref 98–111)
Creatinine, Ser: 0.85 mg/dL (ref 0.61–1.24)
GFR, Estimated: >60 mL/min (ref >60)
Glucose, Bld: 131 mg/dL — ABNORMAL HIGH (ref 70–99)
Potassium: 4.3 mmol/L (ref 3.5–5.1)
Sodium: 138 mmol/L (ref 135–145)

## 2022-08-12 LAB — CBC
HCT: 32.6 % — ABNORMAL LOW (ref 39.0–52.0)
Hemoglobin: 10.9 g/dL — ABNORMAL LOW (ref 13.0–17.0)
MCH: 35.7 pg — ABNORMAL HIGH (ref 26.0–34.0)
MCHC: 33.4 g/dL (ref 30.0–36.0)
MCV: 106.9 fL — ABNORMAL HIGH (ref 80.0–100.0)
Platelets: 346 10*3/uL (ref 150–400)
RBC: 3.05 MIL/uL — ABNORMAL LOW (ref 4.22–5.81)
RDW: 12.9 % (ref 11.5–15.5)
WBC: 10.8 10*3/uL — ABNORMAL HIGH (ref 4.0–10.5)
nRBC: 0 % (ref 0.0–0.2)

## 2022-08-12 LAB — HIV ANTIBODY (ROUTINE TESTING W REFLEX): HIV Screen 4th Generation wRfx: NONREACTIVE

## 2022-08-12 MED ORDER — VANCOMYCIN HCL IN DEXTROSE 1-5 GM/200ML-% IV SOLN
1000.0000 mg | Freq: Once | INTRAVENOUS | Status: AC
  Administered 2022-08-12: 1000 mg via INTRAVENOUS
  Filled 2022-08-12: qty 200

## 2022-08-12 MED ORDER — ENSURE ENLIVE PO LIQD
237.0000 mL | Freq: Three times a day (TID) | ORAL | Status: DC
  Administered 2022-08-12 – 2022-08-14 (×5): 237 mL via ORAL

## 2022-08-12 MED ORDER — VANCOMYCIN HCL IN DEXTROSE 1-5 GM/200ML-% IV SOLN
1000.0000 mg | INTRAVENOUS | Status: DC
  Administered 2022-08-13: 1000 mg via INTRAVENOUS
  Filled 2022-08-12: qty 200

## 2022-08-12 MED ORDER — OSELTAMIVIR PHOSPHATE 75 MG PO CAPS
75.0000 mg | ORAL_CAPSULE | Freq: Two times a day (BID) | ORAL | Status: DC
  Administered 2022-08-12 – 2022-08-14 (×4): 75 mg via ORAL
  Filled 2022-08-12 (×4): qty 1

## 2022-08-12 NOTE — Progress Notes (Signed)
Patient has refused CPAP for tonight.  States that he quit wearing it at home; had trouble getting used to it and just quit using.

## 2022-08-12 NOTE — Progress Notes (Signed)
Report was given to [employer].

## 2022-08-12 NOTE — Progress Notes (Signed)
Pt c/o increased SOB, expiratory wheezes noted bilaterally with increased WOB, resp 22/min. SaO2 98% on 2 lpm [place]. Pt requesting prn albuterol inhaler for use. States he usually gets more SOB after eating and pt has just finished meal brought in by family. RT notified for prn neb tx. Pt and wife aware and agreeable.

## 2022-08-12 NOTE — Progress Notes (Signed)
PHARMACY - PHYSICIAN COMMUNICATION CRITICAL VALUE ALERT - BLOOD CULTURE IDENTIFICATION (BCID)  Dakota Patterson is an 62 y.o. male who presented to [facility] on 08/11/2022 with a chief complaint of flu and pneumonia.   Assessment:  Patient now growing 2/4 bottles GPC noted to be staph species.   Name of physician (or Provider) ContactedManuella Ghazi MD  Current antibiotics: Azithromycin and ceftriaxone  Changes to prescribed antibiotics recommended:  Recommendations accepted by provider Will add vancomycin, check pcr and reculture.   Results for orders placed or performed during the hospital encounter of 08/11/22  Blood Culture ID Panel (Reflexed) (Collected: 08/11/2022  3:28 PM)  Result Value Ref Range   Enterococcus faecalis NOT DETECTED NOT DETECTED   Enterococcus Faecium NOT DETECTED NOT DETECTED   Listeria monocytogenes NOT DETECTED NOT DETECTED   Staphylococcus species DETECTED (A) NOT DETECTED   Staphylococcus aureus (BCID) NOT DETECTED NOT DETECTED   Staphylococcus epidermidis NOT DETECTED NOT DETECTED   Staphylococcus lugdunensis NOT DETECTED NOT DETECTED   Streptococcus species NOT DETECTED NOT DETECTED   Streptococcus agalactiae NOT DETECTED NOT DETECTED   Streptococcus pneumoniae NOT DETECTED NOT DETECTED   Streptococcus pyogenes NOT DETECTED NOT DETECTED   A.calcoaceticus-baumannii NOT DETECTED NOT DETECTED   Bacteroides fragilis NOT DETECTED NOT DETECTED   Enterobacterales NOT DETECTED NOT DETECTED   Enterobacter cloacae complex NOT DETECTED NOT DETECTED   Escherichia coli NOT DETECTED NOT DETECTED   Klebsiella aerogenes NOT DETECTED NOT DETECTED   Klebsiella oxytoca NOT DETECTED NOT DETECTED   Klebsiella pneumoniae NOT DETECTED NOT DETECTED   Proteus species NOT DETECTED NOT DETECTED   Salmonella species NOT DETECTED NOT DETECTED   Serratia marcescens NOT DETECTED NOT DETECTED   Haemophilus influenzae NOT DETECTED NOT DETECTED   Neisseria meningitidis NOT DETECTED NOT  DETECTED   Pseudomonas aeruginosa NOT DETECTED NOT DETECTED   Stenotrophomonas maltophilia NOT DETECTED NOT DETECTED   Candida albicans NOT DETECTED NOT DETECTED   Candida auris NOT DETECTED NOT DETECTED   Candida glabrata NOT DETECTED NOT DETECTED   Candida krusei NOT DETECTED NOT DETECTED   Candida parapsilosis NOT DETECTED NOT DETECTED   Candida tropicalis NOT DETECTED NOT DETECTED   Cryptococcus neoformans/gattii NOT DETECTED NOT DETECTED    Erin Hearing PharmD., BCPS Clinical Pharmacist 08/12/2022 2:50 PM

## 2022-08-12 NOTE — Progress Notes (Signed)
Initial Nutrition Assessment  DOCUMENTATION CODES:   Severe malnutrition in context of chronic illness  INTERVENTION:  Regular diet  Ensure Enlive po TID (chocolate)   NUTRITION DIAGNOSIS:   Severe Malnutrition related to cancer and cancer related treatments as evidenced by severe muscle depletion, energy intake < 75% for > or equal to 1 month, percent weight loss (22%)    GOAL:  Patient will meet greater than or equal to 90% of their needs (if feasible given his advanced cancer diagnosis)   MONITOR:  PO intake, Supplement acceptance, Skin, Labs, Weight trends  REASON FOR ASSESSMENT:   Malnutrition Screening Tool    ASSESSMENT: Patient is a 62 yo male with DM2, HTN, stage IV metastatic lung cancer. Presents with shortness of breath.   Patient lives with his wife. He says, "I have no appetite". Patient lunch is here 0% intake. Able to feed himself. There's an empty milk carton and he is drinking a cola. Patient likes Ensure - provided chocolate and he was drinking during RD visit. Patient says his wife is brining him something to eat for dinner.  Patient reports usual body weight- 140-145 lb. Currently 108.6 lb (49.4 kg). Weight loss over the past year 22% which is significant.  Patient ambulated 75 feet with PT today.   Medications: insulin, MVI, Thiamine, Tamiflu.   IV antibiotics.      Latest Ref Rng & Units 08/12/2022    4:15 AM 08/11/2022    2:36 PM 09/29/2015    4:40 AM  BMP  Glucose 70 - 99 mg/dL 131  173  153   BUN 8 - 23 mg/dL 16  19  11    Creatinine 0.61 - 1.24 mg/dL 0.85  1.06  0.87   Sodium 135 - 145 mmol/L 138  137  139   Potassium 3.5 - 5.1 mmol/L 4.3  4.2  4.4   Chloride 98 - 111 mmol/L 109  98  105   CO2 22 - 32 mmol/L 21  24  23    Calcium 8.9 - 10.3 mg/dL 7.7  9.4  8.8       NUTRITION - FOCUSED PHYSICAL EXAM:  Flowsheet Row Most Recent Value  Orbital Region Moderate depletion  Upper Arm Region Severe depletion  Thoracic and Lumbar Region  Moderate depletion  Buccal Region Moderate depletion  Temple Region Moderate depletion  Clavicle Bone Region Moderate depletion  Clavicle and Acromion Bone Region Moderate depletion  Dorsal Hand Severe depletion  Patellar Region Severe depletion  Anterior Thigh Region Severe depletion  Posterior Calf Region Severe depletion  Edema (RD Assessment) Mild  Hair Reviewed  Eyes Reviewed  Mouth Reviewed  Skin Reviewed  Nails Reviewed       Diet Order:   Diet Order             Diet regular Fluid consistency: Thin  Diet effective now                   EDUCATION NEEDS:  Not appropriate for education at this time  Skin:  Skin Assessment: Reviewed RN Assessment (dry, redness upper body)  Last BM:  2/12  Height:   Ht Readings from Last 1 Encounters:  08/11/22 5\' 3"  (1.6 m)    Weight:   Wt Readings from Last 1 Encounters:  08/11/22 49.4 kg    Ideal Body Weight:   52 kg  BMI:  Body mass index is 19.31 kg/m.  Estimated Nutritional Needs:   Kcal:  1800-2000  Protein:  90-99  gr  Fluid:  >1400 ml daily   Colman Cater MS,RD,CSG,LDN Contact: Shea Evans
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
## 2022-08-12 NOTE — Progress Notes (Signed)
PROGRESS NOTE    Dakota Patterson  LNL:[id number] DOB: 12/14/1960 DOA: 08/11/2022 PCP: [facility]   Brief Narrative:    Dakota Patterson is a 62 y.o. male with medical history significant for stage IV lung cancer, hypertension, diabetes mellitus. Patient presented to the ED with complaints of difficulty breathing that started 4 days ago.  Patient was admitted with influenza A along with superimposed multifocal pneumonia.  He was empirically started on IV Rocephin and azithromycin.  Assessment & Plan:   Principal Problem:   Influenza A with pneumonia Active Problems:   Stage 4 lung cancer (HCC)   Severe sepsis ([place])   Alcoholism ([place])   HTN (hypertension)   COPD (chronic obstructive pulmonary disease) (HCC)   Diabetes ([place])  Assessment and Plan:   Influenza A with pneumonia Dyspnea, cough.  O2 sats > 95% on room air on my evaluation.  CTA chest-suggest multifocal pneumonia- RML, RLL.  Likely left lower lobe bronchus bronchogenic carcinoma.  Influenza positive.  Likely bacterial coinfection considering elevated procalcitonin at 0.63, so meeting severe sepsis criteria with tachycardia heart rate 120s to 140s, leukocytosis of 14.1, lactic acidosis of 2.5 -Continue IV ceftriaxone and azithromycin -Continue Tamiflu -3 L bolus given, continue N/s 100c/hr x 15hrs -Follow-up blood cultures with no growth noted thus far   Stage 4 lung cancer [facility]) Follows with hematology at [facility], per last notes 05/2022, history of  NSCLC-stage IV.  He was not interested in coming to campus q3 weeks, risking side effects/reduced quality of life.  Palliative care versus hospice was discussed.  Plan was for supportive management.  Chronic RL abd pain-  Recent abdominal CT 05/2022 shows new hypoattenuation nodule in the right adrenal gland worrisome for potential metastasis, also potential site of metastasis -ill-defined hypodensity in right gluteus musculature. - He was to  be evaluated by hospice today.   Alcoholism ([place]) Drinks 3 shots of liquor on average daily.  Last drink was Saturday 2/10.  No history of withdrawals or seizures. Currently calm, no sign of withdrawal. -CIWA as needed -Thiamine, [place], multivitamins -Magnesium, phosphorus   Diabetes (HCC) - SSI- M - Hold Home Lantus 10u - Hgba1c 6.2%   COPD (chronic obstructive pulmonary disease) (HCC) COPD, and OSA on CPAP.  Very faint and sparse wheezing on exam. -125 mg Solu-Medrol given in ED, hold off on further steroids - PRN Mucolytics -Resume home bronchodilator regimen   HTN (hypertension) Stable. -Resume metoprolol    DVT prophylaxis:Lovenox Code Status: DNR Family Communication: Daughter is at bedside 2/14 Disposition Plan:  Status is: Inpatient Remains inpatient appropriate because: Need for IV medications.  Consultants:  None  Procedures:  None  Antimicrobials:  Anti-infectives (From admission, onward)    Start     Dose/Rate Route Frequency Ordered Stop   08/11/22 1700  oseltamivir (TAMIFLU) capsule 75 mg        75 mg Oral Daily 08/11/22 1652 08/16/22 0959   08/11/22 1600  cefTRIAXone (ROCEPHIN) 2 g in sodium chloride 0.9 % 100 mL IVPB        2 g 200 mL/hr over 30 Minutes Intravenous Every 24 hours 08/11/22 1548 08/16/22 1559   08/11/22 1600  azithromycin (ZITHROMAX) 500 mg in sodium chloride 0.9 % 250 mL IVPB        500 mg 250 mL/hr over 60 Minutes Intravenous Every 24 hours 08/11/22 1548 08/16/22 1559       Subjective: Patient seen and evaluated today with no new acute complaints or  concerns. No acute concerns or events noted overnight.  Objective: Vitals:   08/12/22 0555 08/12/22 0748 08/12/22 0750 08/12/22 0848  BP: 123/75   109/76  Pulse: 87   100  Resp: 14     Temp: 97.7 F (36.5 C)     TempSrc: Oral     SpO2: 97% 96% 100%   Weight:      Height:        Intake/Output Summary (Last 24 hours) at 08/12/2022 1051 Last data filed at 08/12/2022  0600 Gross per 24 hour  Intake 3411.67 ml  Output --  Net 3411.67 ml   Filed Weights   08/11/22 1431  Weight: 49.4 kg    Examination:  General exam: Appears calm and comfortable  Respiratory system: Clear to auscultation. Respiratory effort normal. Cardiovascular system: S1 & S2 heard, RRR.  Gastrointestinal system: Abdomen is soft Central nervous system: Alert and awake Extremities: No edema Skin: No significant lesions noted Psychiatry: Flat affect.    Data Reviewed: I have personally reviewed following labs and imaging studies  CBC: Recent Labs  Lab 08/11/22 1436 08/12/22 0415  WBC 14.1* 10.8*  NEUTROABS 9.5*  --   HGB 14.0 10.9*  HCT 42.5 32.6*  MCV 107.3* 106.9*  PLT 450* 151   Basic Metabolic Panel: Recent Labs  Lab 08/11/22 1436 08/12/22 0415  NA 137 138  K 4.2 4.3  CL 98 109  CO2 24 21*  GLUCOSE 173* 131*  BUN 19 16  CREATININE 1.06 0.85  CALCIUM 9.4 7.7*  MG 1.6*  --   PHOS 3.9  --    GFR: Estimated Creatinine Clearance: 63 mL/min (by C-G formula based on SCr of 0.85 mg/dL). Liver Function Tests: Recent Labs  Lab 08/11/22 1436  AST 46*  ALT 24  ALKPHOS 89  BILITOT 0.6  PROT 8.3*  ALBUMIN 3.3*   No results for input(s): "LIPASE", "AMYLASE" in the last 168 hours. No results for input(s): "AMMONIA" in the last 168 hours. Coagulation Profile: Recent Labs  Lab 08/11/22 1436  INR 1.0   Cardiac Enzymes: No results for input(s): "CKTOTAL", "CKMB", "CKMBINDEX", "TROPONINI" in the last 168 hours. BNP (last 3 results) No results for input(s): "PROBNP" in the last 8760 hours. HbA1C: Recent Labs    08/11/22 1436  HGBA1C 6.2*   CBG: Recent Labs  Lab 08/11/22 2204 08/12/22 0747  GLUCAP 286* 152*   Lipid Profile: No results for input(s): "CHOL", "HDL", "LDLCALC", "TRIG", "CHOLHDL", "LDLDIRECT" in the last 72 hours. Thyroid Function Tests: No results for input(s): "TSH", "T4TOTAL", "FREET4", "T3FREE", "THYROIDAB" in the last 72  hours. Anemia Panel: No results for input(s): "VITAMINB12", "FOLATE", "FERRITIN", "TIBC", "IRON", "RETICCTPCT" in the last 72 hours. Sepsis Labs: Recent Labs  Lab 08/11/22 1436 08/11/22 1528 08/11/22 1729  PROCALCITON 0.63  --   --   LATICACIDVEN  --  2.5* 1.6    Recent Results (from the past 240 hour(s))  Culture, blood (Routine x 2)     Status: None (Preliminary result)   Collection Time: 08/11/22  3:28 PM   Specimen: BLOOD RIGHT ARM  Result Value Ref Range Status   Specimen Description BLOOD RIGHT ARM  Final   Special Requests   Final    BOTTLES DRAWN AEROBIC AND ANAEROBIC Blood Culture adequate volume   Culture   Final    NO GROWTH < 24 HOURS Performed at [facility], [address], [place], [place] [address]    Report Status PENDING  Incomplete  Culture, blood (Routine  x 2)     Status: None (Preliminary result)   Collection Time: 08/11/22  3:28 PM   Specimen: BLOOD LEFT ARM  Result Value Ref Range Status   Specimen Description BLOOD LEFT ARM  Final   Special Requests   Final    BOTTLES DRAWN AEROBIC AND ANAEROBIC Blood Culture adequate volume   Culture  Setup Time   Final    GRAM POSITIVE COCCI IN CLUSTERS AEROBIC BOTTLE ONLY Gram Stain Report Called to,Read Back By and Verified WithMarchelle Folks @ 0850 08/12/22 Zoe Lan Performed at [facility], [address], [place], [place] [address]    Culture PENDING  Incomplete   Report Status PENDING  Incomplete  Resp panel by RT-PCR (RSV, Flu A&B, Covid) Anterior Nasal Swab     Status: Abnormal   Collection Time: 08/11/22  3:37 PM   Specimen: Anterior Nasal Swab  Result Value Ref Range Status   SARS Coronavirus 2 by RT PCR NEGATIVE NEGATIVE Final    Comment: (NOTE) SARS-CoV-2 target nucleic acids are NOT DETECTED.  The SARS-CoV-2 RNA is generally detectable in upper respiratory specimens during the acute phase of infection. The lowest concentration of SARS-CoV-2 viral copies this assay can detect is 138 copies/mL.  A negative result does not preclude SARS-Cov-2 infection and should not be used as the sole basis for treatment or other patient management decisions. A negative result may occur with  improper specimen collection/handling, submission of specimen other than nasopharyngeal swab, presence of viral mutation(s) within the areas targeted by this assay, and inadequate number of viral copies(<138 copies/mL). A negative result must be combined with clinical observations, patient history, and epidemiological information. The expected result is Negative.  Fact Sheet for Patients:  EntrepreneurPulse.com.au  Fact Sheet for Healthcare Providers:  IncredibleEmployment.be  This test is no t yet approved or cleared by the [place] FDA and  has been authorized for detection and/or diagnosis of SARS-CoV-2 by FDA under an Emergency Use Authorization (EUA). This EUA will remain  in effect (meaning this test can be used) for the duration of the COVID-19 declaration under Section 564(b)(1) of the Act, 21 U.S.C.section 360bbb-3(b)(1), unless the authorization is terminated  or revoked sooner.       Influenza A by PCR NEGATIVE NEGATIVE Final   Influenza B by PCR POSITIVE (A) NEGATIVE Final    Comment: (NOTE) The Xpert Xpress SARS-CoV-2/FLU/RSV plus assay is intended as an aid in the diagnosis of influenza from Nasopharyngeal swab specimens and should not be used as a sole basis for treatment. Nasal washings and aspirates are unacceptable for Xpert Xpress SARS-CoV-2/FLU/RSV testing.  Fact Sheet for Patients: EntrepreneurPulse.com.au  Fact Sheet for Healthcare Providers: IncredibleEmployment.be  This test is not yet approved or cleared by the [place] FDA and has been authorized for detection and/or diagnosis of SARS-CoV-2 by FDA under an Emergency Use Authorization (EUA). This EUA will remain in effect (meaning this  test can be used) for the duration of the COVID-19 declaration under Section 564(b)(1) of the Act, 21 U.S.C. section 360bbb-3(b)(1), unless the authorization is terminated or revoked.     Resp Syncytial Virus by PCR NEGATIVE NEGATIVE Final    Comment: (NOTE) Fact Sheet for Patients: EntrepreneurPulse.com.au  Fact Sheet for Healthcare Providers: IncredibleEmployment.be  This test is not yet approved or cleared by the [place] FDA and has been authorized for detection and/or diagnosis of SARS-CoV-2 by FDA under an Emergency Use Authorization (EUA). This EUA will remain in effect (meaning this test  can be used) for the duration of the COVID-19 declaration under Section 564(b)(1) of the Act, 21 U.S.C. section 360bbb-3(b)(1), unless the authorization is terminated or revoked.  Performed at [facility], [address], [place], [place] [address]          Radiology Studies: CT Angio Chest PE W and/or Wo Contrast  Result Date: 08/11/2022 CLINICAL DATA:  High probability pulmonary embolism. EXAM: CT ANGIOGRAPHY CHEST WITH CONTRAST TECHNIQUE: Multidetector CT imaging of the chest was performed using the standard protocol during bolus administration of intravenous contrast. Multiplanar CT image reconstructions and MIPs were obtained to evaluate the vascular anatomy. RADIATION DOSE REDUCTION: This exam was performed according to the departmental dose-optimization program which includes automated exposure control, adjustment of the mA and/or kV according to patient size and/or use of iterative reconstruction technique. CONTRAST:  44mL OMNIPAQUE IOHEXOL 350 MG/ML SOLN COMPARISON:  None Available. FINDINGS: Cardiovascular: No filling defects within the pulmonary arteries to suggest acute pulmonary embolism. Coronary artery calcification and aortic atherosclerotic calcification. Mediastinum/Nodes: No mediastinal lymphadenopathy. Peribronchial thickening  surrounding LEFT lower lobe bronchus extending into the LEFT lower lobe. Thickening measures up to 2.5 x 1.8 cm (image 51/6. Coronal imaging there is spiculation associated with this peribronchial thickening as well as narrowing of the LEFT lower lobe pulmonary artery lumen (image 79/9). The peripheral nodular consolidation in the RIGHT middle lobe and RIGHT lower lobe. Example consolidated nodule measuring 1.2 cm on image 106/18. Ill-defined consolidation in the RIGHT middle lobe on image 109/8. A discrete RIGHT lower lobe nodule measuring 7 mm on image 108/8) Lungs/Pleura: Upper Abdomen: There is thickening of the LEFT adrenal gland scratch the there is thickening of the RIGHT adrenal gland 21.7 cm. RIGHT adrenal gland has soft tissue attenuation (HU equal 36). The liver has a nodular contour. Musculoskeletal: No aggressive osseous lesion. Review of the MIP images confirms the above findings. IMPRESSION: 1. No evidence acute pulmonary embolism. 2. Peribronchial thickening surrounding the LEFT lower lobe bronchus. Spiculated margin of the soft tissue thickening. Recommend FDG PET scan to evaluate for bronchogenic carcinoma. 3. Nodular consolidation in the RIGHT middle lobe and RIGHT lower lobe is concerning for multifocal pneumonia. 4. Nodular thickening of the RIGHT adrenal gland is indeterminate. Recommend characterization on above recommended FDG PET scan. 5. Nodular contour of the liver suggests cirrhosis. 6. Aortic atherosclerosis. Aortic Atherosclerosis (ICD10-I70.0). Electronically Signed   By: Suzy Bouchard M.D.   On: 08/11/2022 16:04   DG Chest Port 1 View  Result Date: 08/11/2022 CLINICAL DATA:  Shortness of breath, hypoxia, cancer EXAM: PORTABLE CHEST 1 VIEW COMPARISON:  Radiograph 10/02/2015 FINDINGS: Unchanged cardiomediastinal silhouette. Small nodular opacities in the right peripheral lower lung, new since April 2017. No large effusion or evidence of pneumothorax. Multiple chronic left upper  rib fractures. Thoracic spondylosis. IMPRESSION: Small nodular opacities in the right peripheral lower lung new since April 2017, could reflect an infectious/inflammatory process or possibly related to known malignancy. Electronically Signed   By: Maurine Simmering M.D.   On: 08/11/2022 15:06        Scheduled Meds:  albuterol  2.5 mg Nebulization Q6H WA   enoxaparin (LOVENOX) injection  30 mg Subcutaneous Y60Y   folic acid  1 mg Oral Daily   insulin aspart  0-15 Units Subcutaneous TID WC   insulin aspart  0-5 Units Subcutaneous QHS   metoprolol tartrate  25 mg Oral BID   mometasone-formoterol  2 puff Inhalation BID   multivitamin with minerals  1 tablet Oral Daily  oseltamivir  75 mg Oral Daily   thiamine  100 mg Oral Daily   Or   thiamine  100 mg Intravenous Daily   umeclidinium bromide  1 puff Inhalation Daily   Continuous Infusions:  azithromycin Stopped (08/11/22 1751)   cefTRIAXone (ROCEPHIN)  IV Stopped (08/11/22 1649)     LOS: 1 day    Time spent: 35 minutes    Lukisha Procida Darleen Crocker, DO [facility]  If 7PM-7AM, please contact night-coverage [email or web address] 08/12/2022, 10:51 AM

## 2022-08-12 NOTE — Progress Notes (Signed)
Pharmacy Antibiotic Note  Dakota Patterson is a 63 y.o. male admitted on 08/11/2022 with  influenza and pneumonia . Pharmacy has been consulted on vancomycin dosing for bacteremia.  Pt presented to ED on 2/13 with decreased SpO2, SOB, cough, and congestion. Following diagnosis of influenza and pneumonia, pt was started on IV ceftriaxone and azithromycin as well as Tamiflu. BCID results indicated 2/4 bottles growing GPC of staph species. Will add vancomycin to antibiotic regimen.  Plan:   Vancomycin 1000 mg IV x1 followed by 1000 mg IV as a 1-hour infusion Q 24 hrs. Goal AUC 400-550. Expected AUC: 496.2 SCr used: 0.85 mg/dL   Height: 5\' 3"  (160 cm) Weight: 49.4 kg (109 lb) IBW/kg (Calculated) : 56.9  Temp (24hrs), Avg:98.2 F (36.8 C), Min:97.7 F (36.5 C), Max:98.8 F (37.1 C)  Recent Labs  Lab 08/11/22 1436 08/11/22 1528 08/11/22 1729 08/12/22 0415  WBC 14.1*  --   --  10.8*  CREATININE 1.06  --   --  0.85  LATICACIDVEN  --  2.5* 1.6  --     Estimated Creatinine Clearance: 63 mL/min (by C-G formula based on SCr of 0.85 mg/dL).    Allergies  Allergen Reactions   Lisinopril Other (See Comments)    rash    Antimicrobials this admission: azithromycin 2/13 >> 2/18 ceftriaxone 2/13 >> 2/18 Tamiflu 2/14 >> 2/17  Microbiology results: 2/13 influenza A by PCR: negative 2/13 influenza B by PCR: positive 2/13 RSV by PCR: negative 2/13 SARS Coronavirus-2 by RT PCR: negative 2/13 BCID panel: growing 2/4 bottles GPC staph species  Thank you for allowing pharmacy to be a part of this patient's care.  Charlett Lango 08/12/2022 3:53 PM
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
## 2022-08-13 ENCOUNTER — Encounter (HOSPITAL_COMMUNITY): Payer: Self-pay | Admitting: Internal Medicine

## 2022-08-13 DIAGNOSIS — Z7189 Other specified counseling: Secondary | ICD-10-CM | POA: Diagnosis not present

## 2022-08-13 DIAGNOSIS — Z515 Encounter for palliative care: Secondary | ICD-10-CM

## 2022-08-13 DIAGNOSIS — J09X1 Influenza due to identified novel influenza A virus with pneumonia: Secondary | ICD-10-CM | POA: Diagnosis not present

## 2022-08-13 DIAGNOSIS — E43 Unspecified severe protein-calorie malnutrition: Secondary | ICD-10-CM

## 2022-08-13 DIAGNOSIS — C3490 Malignant neoplasm of unspecified part of unspecified bronchus or lung: Secondary | ICD-10-CM

## 2022-08-13 LAB — GLUCOSE, CAPILLARY
Glucose-Capillary: 118 mg/dL — ABNORMAL HIGH (ref 70–99)
Glucose-Capillary: 137 mg/dL — ABNORMAL HIGH (ref 70–99)
Glucose-Capillary: 192 mg/dL — ABNORMAL HIGH (ref 70–99)
Glucose-Capillary: 279 mg/dL — ABNORMAL HIGH (ref 70–99)

## 2022-08-13 LAB — CBC
HCT: 32 % — ABNORMAL LOW (ref 39.0–52.0)
Hemoglobin: 10.3 g/dL — ABNORMAL LOW (ref 13.0–17.0)
MCH: 35 pg — ABNORMAL HIGH (ref 26.0–34.0)
MCHC: 32.2 g/dL (ref 30.0–36.0)
MCV: 108.8 fL — ABNORMAL HIGH (ref 80.0–100.0)
Platelets: 350 10*3/uL (ref 150–400)
RBC: 2.94 MIL/uL — ABNORMAL LOW (ref 4.22–5.81)
RDW: 12.8 % (ref 11.5–15.5)
WBC: 11.3 10*3/uL — ABNORMAL HIGH (ref 4.0–10.5)
nRBC: 0 % (ref 0.0–0.2)

## 2022-08-13 LAB — MAGNESIUM: Magnesium: 1.6 mg/dL — ABNORMAL LOW (ref 1.7–2.4)

## 2022-08-13 LAB — BASIC METABOLIC PANEL
Anion gap: 8 (ref 5–15)
BUN: 16 mg/dL (ref 8–23)
CO2: 24 mmol/L (ref 22–32)
Calcium: 7.9 mg/dL — ABNORMAL LOW (ref 8.9–10.3)
Chloride: 107 mmol/L (ref 98–111)
Creatinine, Ser: 0.76 mg/dL (ref 0.61–1.24)
GFR, Estimated: >60 mL/min (ref >60)
Glucose, Bld: 120 mg/dL — ABNORMAL HIGH (ref 70–99)
Potassium: 3.6 mmol/L (ref 3.5–5.1)
Sodium: 139 mmol/L (ref 135–145)

## 2022-08-13 MED ORDER — BUDESONIDE 0.25 MG/2ML IN SUSP
0.2500 mg | Freq: Two times a day (BID) | RESPIRATORY_TRACT | Status: DC
  Administered 2022-08-13 – 2022-08-14 (×3): 0.25 mg via RESPIRATORY_TRACT
  Filled 2022-08-13 (×3): qty 2

## 2022-08-13 MED ORDER — MAGNESIUM SULFATE 2 GM/50ML IV SOLN
2.0000 g | Freq: Once | INTRAVENOUS | Status: AC
  Administered 2022-08-13: 2 g via INTRAVENOUS
  Filled 2022-08-13: qty 50

## 2022-08-13 MED ORDER — METHYLPREDNISOLONE SODIUM SUCC 40 MG IJ SOLR
40.0000 mg | Freq: Two times a day (BID) | INTRAMUSCULAR | Status: DC
  Administered 2022-08-13 (×2): 40 mg via INTRAVENOUS
  Filled 2022-08-13 (×3): qty 1

## 2022-08-13 MED ORDER — IPRATROPIUM-ALBUTEROL 0.5-2.5 (3) MG/3ML IN SOLN
3.0000 mL | Freq: Four times a day (QID) | RESPIRATORY_TRACT | Status: DC
  Administered 2022-08-13 – 2022-08-14 (×4): 3 mL via RESPIRATORY_TRACT
  Filled 2022-08-13 (×4): qty 3

## 2022-08-13 NOTE — TOC Progression Note (Signed)
Transition of Care [facility]) - Progression Note    Patient Details  Name: Dakota Patterson MRN: [id number] Date of Birth: 05-29-1961  [facility]) CM/SW Contact  Salome Arnt, [place] Phone Number: 08/13/2022, 10:24 AM  Clinical Narrative:  LCSW followed up with Glennis Brink at [facility]. Possible d/c tomorrow per MD. Joelene Millin indicates they will plan to follow up with pt on day of d/c and can admit same day.      Expected Discharge Plan: Home/Self Care Barriers to Discharge: Continued Medical Work up  Expected Discharge Plan and Services In-house Referral: Clinical Social Work     Living arrangements for the past 2 months: Single Family Home                                       Social Determinants of Health (SDOH) Interventions SDOH Screenings   Food Insecurity: No Food Insecurity (08/11/2022)  Housing: Low Risk  (08/11/2022)  Transportation Needs: No Transportation Needs (08/11/2022)  Utilities: Not At Risk (08/11/2022)  Tobacco Use: High Risk (08/11/2022)    Readmission Risk Interventions     No data to display

## 2022-08-13 NOTE — Progress Notes (Signed)
PROGRESS NOTE    Dakota Patterson  RKY:[id number] DOB: 06-13-1961 DOA: 08/11/2022 PCP: [facility]   Brief Narrative:    Dakota Patterson is a 62 y.o. male with medical history significant for stage IV lung cancer, hypertension, diabetes mellitus. Patient presented to the ED with complaints of difficulty breathing that started 4 days ago.  Patient was admitted with influenza A along with superimposed multifocal pneumonia.  He was empirically started on IV Rocephin and azithromycin. He continues to have some ongoing shortness of breath that will require use of IV steroids.  Palliative care consulted for further assistance in management.  Assessment & Plan:   Principal Problem:   Influenza A with pneumonia Active Problems:   Stage 4 lung cancer (HCC)   Severe sepsis (HCC)   Alcoholism ([place])   HTN (hypertension)   COPD (chronic obstructive pulmonary disease) (HCC)   Diabetes (HCC)   Protein-calorie malnutrition, severe  Assessment and Plan:   Influenza A with pneumonia Dyspnea, cough.  O2 sats > 95% on room air on my evaluation.  CTA chest-suggest multifocal pneumonia- RML, RLL.  Likely left lower lobe bronchus bronchogenic carcinoma.  Influenza positive.  Likely bacterial coinfection considering elevated procalcitonin at 0.63, so meeting severe sepsis criteria with tachycardia heart rate 120s to 140s, leukocytosis of 14.1, lactic acidosis of 2.5 -Continue IV ceftriaxone and azithromycin -Continue Tamiflu -3 L bolus given, continue N/s 100c/hr x 15hrs -Blood cultures with staph species noted in 1 set and therefore, repeat blood cultures ordered and pending as well as MRSA screen.   Stage 4 lung cancer [facility]) Follows with hematology at [facility], per last notes 05/2022, history of  NSCLC-stage IV.  He was not interested in coming to campus q3 weeks, risking side effects/reduced quality of life.  Palliative care versus hospice was discussed.  Plan was for  supportive management.  Chronic RL abd pain-  Recent abdominal CT 05/2022 shows new hypoattenuation nodule in the right adrenal gland worrisome for potential metastasis, also potential site of metastasis -ill-defined hypodensity in right gluteus musculature. - He was to be evaluated by hospice today. -Plan to evaluate with palliative care while inpatient and try to see if amenable to discharge with home hospice.   Alcoholism ([place]) Drinks 3 shots of liquor on average daily.  Last drink was Saturday 2/10.  No history of withdrawals or seizures. Currently calm, no sign of withdrawal. -CIWA as needed -Thiamine, Foley, multivitamins -Magnesium, phosphorus   Diabetes (HCC) - SSI- M - Hold Home Lantus 10u - Hgba1c 6.2%   COPD (chronic obstructive pulmonary disease) (HCC) COPD, and OSA on CPAP.  Very faint and sparse wheezing on exam. -DuoNebs every 6 hours as scheduled -IV Solu-Medrol 40 mg twice daily as scheduled   HTN (hypertension) Stable. -Resume metoprolol     DVT prophylaxis:Lovenox Code Status: DNR Family Communication: Daughter is at bedside 2/14 Disposition Plan:  Status is: Inpatient Remains inpatient appropriate because: Need for IV medications.   Consultants:  Palliative   Procedures:  None   Antimicrobials:  Anti-infectives (From admission, onward)    Start     Dose/Rate Route Frequency Ordered Stop   08/13/22 1800  vancomycin (VANCOCIN) IVPB 1000 mg/200 mL premix        1,000 mg 200 mL/hr over 60 Minutes Intravenous Every 24 hours 08/12/22 1608     08/12/22 2200  oseltamivir (TAMIFLU) capsule 75 mg        75 mg Oral 2 times daily 08/12/22 1120  08/15/22 2159   08/12/22 1600  vancomycin (VANCOCIN) IVPB 1000 mg/200 mL premix        1,000 mg 200 mL/hr over 60 Minutes Intravenous  Once 08/12/22 1459 08/12/22 1815   08/11/22 1700  oseltamivir (TAMIFLU) capsule 75 mg  Status:  Discontinued        75 mg Oral Daily 08/11/22 1652 08/12/22 1120   08/11/22 1600   cefTRIAXone (ROCEPHIN) 2 g in sodium chloride 0.9 % 100 mL IVPB        2 g 200 mL/hr over 30 Minutes Intravenous Every 24 hours 08/11/22 1548 08/16/22 1559   08/11/22 1600  azithromycin (ZITHROMAX) 500 mg in sodium chloride 0.9 % 250 mL IVPB        500 mg 250 mL/hr over 60 Minutes Intravenous Every 24 hours 08/11/22 1548 08/16/22 1559      Subjective: Patient seen and evaluated today with worsening shortness of breath noted overnight and he is still having some wheezing. Refuses CPAP at night.  Objective: Vitals:   08/13/22 0203 08/13/22 0542 08/13/22 0744 08/13/22 0747  BP:  104/69    Pulse:  98    Resp:  18    Temp:  98 F (36.7 C)    TempSrc:      SpO2: 90% 98% (!) 88% 94%  Weight:      Height:        Intake/Output Summary (Last 24 hours) at 08/13/2022 0910 Last data filed at 08/13/2022 0700 Gross per 24 hour  Intake 590 ml  Output --  Net 590 ml   Filed Weights   08/11/22 1431  Weight: 49.4 kg    Examination:  General exam: Appears calm and comfortable  Respiratory system: Wheezing bilaterally. Respiratory effort normal. 3L [place] Cardiovascular system: S1 & S2 heard, RRR.  Gastrointestinal system: Abdomen is soft Central nervous system: Alert and awake Extremities: No edema Skin: No significant lesions noted Psychiatry: Flat affect.    Data Reviewed: I have personally reviewed following labs and imaging studies  CBC: Recent Labs  Lab 08/11/22 1436 08/12/22 0415 08/13/22 0428  WBC 14.1* 10.8* 11.3*  NEUTROABS 9.5*  --   --   HGB 14.0 10.9* 10.3*  HCT 42.5 32.6* 32.0*  MCV 107.3* 106.9* 108.8*  PLT 450* 346 762   Basic Metabolic Panel: Recent Labs  Lab 08/11/22 1436 08/12/22 0415 08/13/22 0428  NA 137 138 139  K 4.2 4.3 3.6  CL 98 109 107  CO2 24 21* 24  GLUCOSE 173* 131* 120*  BUN 19 16 16   CREATININE 1.06 0.85 0.76  CALCIUM 9.4 7.7* 7.9*  MG 1.6*  --  1.6*  PHOS 3.9  --   --    GFR: Estimated Creatinine Clearance: 66.9 mL/min (by C-G  formula based on SCr of 0.76 mg/dL). Liver Function Tests: Recent Labs  Lab 08/11/22 1436  AST 46*  ALT 24  ALKPHOS 89  BILITOT 0.6  PROT 8.3*  ALBUMIN 3.3*   No results for input(s): "LIPASE", "AMYLASE" in the last 168 hours. No results for input(s): "AMMONIA" in the last 168 hours. Coagulation Profile: Recent Labs  Lab 08/11/22 1436  INR 1.0   Cardiac Enzymes: No results for input(s): "CKTOTAL", "CKMB", "CKMBINDEX", "TROPONINI" in the last 168 hours. BNP (last 3 results) No results for input(s): "PROBNP" in the last 8760 hours. HbA1C: Recent Labs    08/11/22 1436  HGBA1C 6.2*   CBG: Recent Labs  Lab 08/12/22 0747 08/12/22 1107 08/12/22 1647 08/12/22 2133 08/13/22  [place] 118*   Lipid Profile: No results for input(s): "CHOL", "HDL", "LDLCALC", "TRIG", "CHOLHDL", "LDLDIRECT" in the last 72 hours. Thyroid Function Tests: No results for input(s): "TSH", "T4TOTAL", "FREET4", "T3FREE", "THYROIDAB" in the last 72 hours. Anemia Panel: No results for input(s): "VITAMINB12", "FOLATE", "FERRITIN", "TIBC", "IRON", "RETICCTPCT" in the last 72 hours. Sepsis Labs: Recent Labs  Lab 08/11/22 1436 08/11/22 1528 08/11/22 1729  PROCALCITON 0.63  --   --   LATICACIDVEN  --  2.5* 1.6    Recent Results (from the past 240 hour(s))  Culture, blood (Routine x 2)     Status: None (Preliminary result)   Collection Time: 08/11/22  3:28 PM   Specimen: BLOOD RIGHT ARM  Result Value Ref Range Status   Specimen Description BLOOD RIGHT ARM  Final   Special Requests   Final    BOTTLES DRAWN AEROBIC AND ANAEROBIC Blood Culture adequate volume   Culture   Final    NO GROWTH 2 DAYS Performed at [facility], [address] [place], [place] [address]    Report Status PENDING  Incomplete  Culture, blood (Routine x 2)     Status: None (Preliminary result)   Collection Time: 08/11/22  3:28 PM   Specimen: BLOOD LEFT ARM  Result Value Ref Range Status   Specimen  Description   Final    BLOOD LEFT ARM Performed at [facility], [address], [place], [place] [address]    Special Requests   Final    BOTTLES DRAWN AEROBIC AND ANAEROBIC Blood Culture adequate volume Performed at [facility], [address], [place], [place] [address]    Culture  Setup Time   Final    GRAM POSITIVE COCCI IN CLUSTERS BOTH Gram Stain Report Called to,Read Back By and Verified WithElsie Stain, Jerilynn Mages @ 0850 08/12/22 NIELSEN, S CRITICAL RESULT CALLED TO, READ BACK BY AND VERIFIED WITH: PHARMD FRANK WILSON  ON 08/12/22 @ 1439 BY DRT Performed at [facility], [place] [address], [place],  [address]    Culture GRAM POSITIVE COCCI  Final   Report Status PENDING  Incomplete  Blood Culture ID Panel (Reflexed)     Status: Abnormal   Collection Time: 08/11/22  3:28 PM  Result Value Ref Range Status   Enterococcus faecalis NOT DETECTED NOT DETECTED Final   Enterococcus Faecium NOT DETECTED NOT DETECTED Final   Listeria monocytogenes NOT DETECTED NOT DETECTED Final   Staphylococcus species DETECTED (A) NOT DETECTED Final    Comment: CRITICAL RESULT CALLED TO, READ BACK BY AND VERIFIED WITH: [place]  ON 08/12/22 @ 1439 BY DRT    Staphylococcus aureus (BCID) NOT DETECTED NOT DETECTED Final   Staphylococcus epidermidis NOT DETECTED NOT DETECTED Final   Staphylococcus lugdunensis NOT DETECTED NOT DETECTED Final   Streptococcus species NOT DETECTED NOT DETECTED Final   Streptococcus agalactiae NOT DETECTED NOT DETECTED Final   Streptococcus pneumoniae NOT DETECTED NOT DETECTED Final   Streptococcus pyogenes NOT DETECTED NOT DETECTED Final   A.calcoaceticus-baumannii NOT DETECTED NOT DETECTED Final   Bacteroides fragilis NOT DETECTED NOT DETECTED Final   Enterobacterales NOT DETECTED NOT DETECTED Final   Enterobacter cloacae complex NOT DETECTED NOT DETECTED Final   Escherichia coli NOT DETECTED NOT DETECTED Final   Klebsiella aerogenes NOT DETECTED NOT DETECTED  Final   Klebsiella oxytoca NOT DETECTED NOT DETECTED Final   Klebsiella pneumoniae NOT DETECTED NOT DETECTED Final   Proteus species NOT DETECTED NOT DETECTED Final  Salmonella species NOT DETECTED NOT DETECTED Final   Serratia marcescens NOT DETECTED NOT DETECTED Final   Haemophilus influenzae NOT DETECTED NOT DETECTED Final   Neisseria meningitidis NOT DETECTED NOT DETECTED Final   Pseudomonas aeruginosa NOT DETECTED NOT DETECTED Final   Stenotrophomonas maltophilia NOT DETECTED NOT DETECTED Final   Candida albicans NOT DETECTED NOT DETECTED Final   Candida auris NOT DETECTED NOT DETECTED Final   Candida glabrata NOT DETECTED NOT DETECTED Final   Candida krusei NOT DETECTED NOT DETECTED Final   Candida parapsilosis NOT DETECTED NOT DETECTED Final   Candida tropicalis NOT DETECTED NOT DETECTED Final   Cryptococcus neoformans/gattii NOT DETECTED NOT DETECTED Final    Comment: Performed at [facility], [place] [address] [place], [place] [address]  Resp panel by RT-PCR (RSV, Flu A&B, Covid) Anterior Nasal Swab     Status: Abnormal   Collection Time: 08/11/22  3:37 PM   Specimen: Anterior Nasal Swab  Result Value Ref Range Status   SARS Coronavirus 2 by RT PCR NEGATIVE NEGATIVE Final    Comment: (NOTE) SARS-CoV-2 target nucleic acids are NOT DETECTED.  The SARS-CoV-2 RNA is generally detectable in upper respiratory specimens during the acute phase of infection. The lowest concentration of SARS-CoV-2 viral copies this assay can detect is 138 copies/mL. A negative result does not preclude SARS-Cov-2 infection and should not be used as the sole basis for treatment or other patient management decisions. A negative result may occur with  improper specimen collection/handling, submission of specimen other than nasopharyngeal swab, presence of viral mutation(s) within the areas targeted by this assay, and inadequate number of viral copies(<138 copies/mL). A negative result must be  combined with clinical observations, patient history, and epidemiological information. The expected result is Negative.  Fact Sheet for Patients:  EntrepreneurPulse.com.au  Fact Sheet for Healthcare Providers:  IncredibleEmployment.be  This test is no t yet approved or cleared by the [place] FDA and  has been authorized for detection and/or diagnosis of SARS-CoV-2 by FDA under an Emergency Use Authorization (EUA). This EUA will remain  in effect (meaning this test can be used) for the duration of the COVID-19 declaration under Section 564(b)(1) of the Act, 21 U.S.C.section 360bbb-3(b)(1), unless the authorization is terminated  or revoked sooner.       Influenza A by PCR NEGATIVE NEGATIVE Final   Influenza B by PCR POSITIVE (A) NEGATIVE Final    Comment: (NOTE) The Xpert Xpress SARS-CoV-2/FLU/RSV plus assay is intended as an aid in the diagnosis of influenza from Nasopharyngeal swab specimens and should not be used as a sole basis for treatment. Nasal washings and aspirates are unacceptable for Xpert Xpress SARS-CoV-2/FLU/RSV testing.  Fact Sheet for Patients: EntrepreneurPulse.com.au  Fact Sheet for Healthcare Providers: IncredibleEmployment.be  This test is not yet approved or cleared by the [place] FDA and has been authorized for detection and/or diagnosis of SARS-CoV-2 by FDA under an Emergency Use Authorization (EUA). This EUA will remain in effect (meaning this test can be used) for the duration of the COVID-19 declaration under Section 564(b)(1) of the Act, 21 U.S.C. section 360bbb-3(b)(1), unless the authorization is terminated or revoked.     Resp Syncytial Virus by PCR NEGATIVE NEGATIVE Final    Comment: (NOTE) Fact Sheet for Patients: EntrepreneurPulse.com.au  Fact Sheet for Healthcare Providers: IncredibleEmployment.be  This test is not  yet approved or cleared by the [place] FDA and has been authorized for detection and/or diagnosis of SARS-CoV-2 by FDA under an Emergency  Use Authorization (EUA). This EUA will remain in effect (meaning this test can be used) for the duration of the COVID-19 declaration under Section 564(b)(1) of the Act, 21 U.S.C. section 360bbb-3(b)(1), unless the authorization is terminated or revoked.  Performed at [facility], [address], [place], [place] [address]   Culture, blood (Routine X 2) w Reflex to ID Panel     Status: None (Preliminary result)   Collection Time: 08/12/22  3:49 PM   Specimen: Right Antecubital; Blood  Result Value Ref Range Status   Specimen Description RIGHT ANTECUBITAL  Final   Special Requests   Final    BOTTLES DRAWN AEROBIC AND ANAEROBIC Blood Culture adequate volume   Culture   Final    NO GROWTH < 12 HOURS Performed at [facility], [address], [place], [place] [address]    Report Status PENDING  Incomplete  Culture, blood (Routine X 2) w Reflex to ID Panel     Status: None (Preliminary result)   Collection Time: 08/12/22  3:49 PM   Specimen: BLOOD RIGHT HAND  Result Value Ref Range Status   Specimen Description BLOOD RIGHT HAND  Final   Special Requests   Final    BOTTLES DRAWN AEROBIC AND ANAEROBIC Blood Culture results may not be optimal due to an excessive volume of blood received in culture bottles   Culture   Final    NO GROWTH < 12 HOURS Performed at [facility], [address] [place], [place] [address]    Report Status PENDING  Incomplete         Radiology Studies: CT Angio Chest PE W and/or Wo Contrast  Result Date: 08/11/2022 CLINICAL DATA:  High probability pulmonary embolism. EXAM: CT ANGIOGRAPHY CHEST WITH CONTRAST TECHNIQUE: Multidetector CT imaging of the chest was performed using the standard protocol during bolus administration of intravenous contrast. Multiplanar CT image reconstructions and MIPs were obtained to  evaluate the vascular anatomy. RADIATION DOSE REDUCTION: This exam was performed according to the departmental dose-optimization program which includes automated exposure control, adjustment of the mA and/or kV according to patient size and/or use of iterative reconstruction technique. CONTRAST:  23mL OMNIPAQUE IOHEXOL 350 MG/ML SOLN COMPARISON:  None Available. FINDINGS: Cardiovascular: No filling defects within the pulmonary arteries to suggest acute pulmonary embolism. Coronary artery calcification and aortic atherosclerotic calcification. Mediastinum/Nodes: No mediastinal lymphadenopathy. Peribronchial thickening surrounding LEFT lower lobe bronchus extending into the LEFT lower lobe. Thickening measures up to 2.5 x 1.8 cm (image 51/6. Coronal imaging there is spiculation associated with this peribronchial thickening as well as narrowing of the LEFT lower lobe pulmonary artery lumen (image 79/9). The peripheral nodular consolidation in the RIGHT middle lobe and RIGHT lower lobe. Example consolidated nodule measuring 1.2 cm on image 106/18. Ill-defined consolidation in the RIGHT middle lobe on image 109/8. A discrete RIGHT lower lobe nodule measuring 7 mm on image 108/8) Lungs/Pleura: Upper Abdomen: There is thickening of the LEFT adrenal gland scratch the there is thickening of the RIGHT adrenal gland 21.7 cm. RIGHT adrenal gland has soft tissue attenuation (HU equal 36). The liver has a nodular contour. Musculoskeletal: No aggressive osseous lesion. Review of the MIP images confirms the above findings. IMPRESSION: 1. No evidence acute pulmonary embolism. 2. Peribronchial thickening surrounding the LEFT lower lobe bronchus. Spiculated margin of the soft tissue thickening. Recommend FDG PET scan to evaluate for bronchogenic carcinoma. 3. Nodular consolidation in the RIGHT middle lobe and RIGHT lower lobe is concerning for multifocal pneumonia. 4. Nodular thickening  of the RIGHT adrenal gland is indeterminate.  Recommend characterization on above recommended FDG PET scan. 5. Nodular contour of the liver suggests cirrhosis. 6. Aortic atherosclerosis. Aortic Atherosclerosis (ICD10-I70.0). Electronically Signed   By: Suzy Bouchard M.D.   On: 08/11/2022 16:04   DG Chest Port 1 View  Result Date: 08/11/2022 CLINICAL DATA:  Shortness of breath, hypoxia, cancer EXAM: PORTABLE CHEST 1 VIEW COMPARISON:  Radiograph 10/02/2015 FINDINGS: Unchanged cardiomediastinal silhouette. Small nodular opacities in the right peripheral lower lung, new since April 2017. No large effusion or evidence of pneumothorax. Multiple chronic left upper rib fractures. Thoracic spondylosis. IMPRESSION: Small nodular opacities in the right peripheral lower lung new since April 2017, could reflect an infectious/inflammatory process or possibly related to known malignancy. Electronically Signed   By: Maurine Simmering M.D.   On: 08/11/2022 15:06        Scheduled Meds:  budesonide (PULMICORT) nebulizer solution  0.25 mg Nebulization BID   enoxaparin (LOVENOX) injection  30 mg Subcutaneous Q24H   feeding supplement  237 mL Oral TID BM   folic acid  1 mg Oral Daily   insulin aspart  0-15 Units Subcutaneous TID WC   insulin aspart  0-5 Units Subcutaneous QHS   ipratropium-albuterol  3 mL Nebulization Q6H   methylPREDNISolone (SOLU-MEDROL) injection  40 mg Intravenous Q12H   metoprolol tartrate  25 mg Oral BID   multivitamin with minerals  1 tablet Oral Daily   oseltamivir  75 mg Oral BID   thiamine  100 mg Oral Daily   Or   thiamine  100 mg Intravenous Daily   umeclidinium bromide  1 puff Inhalation Daily   Continuous Infusions:  azithromycin 500 mg (08/12/22 1834)   cefTRIAXone (ROCEPHIN)  IV 2 g (08/12/22 2031)   magnesium sulfate bolus IVPB     vancomycin       LOS: 2 days    Time spent: 35 minutes    Dandy Lazaro Darleen Crocker, DO [facility]  If 7PM-7AM, please contact night-coverage [email or web address] 08/13/2022, 9:10 AM
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
## 2022-08-14 DIAGNOSIS — J09X1 Influenza due to identified novel influenza A virus with pneumonia: Secondary | ICD-10-CM | POA: Diagnosis not present

## 2022-08-14 LAB — BASIC METABOLIC PANEL
Anion gap: 7 (ref 5–15)
BUN: 12 mg/dL (ref 8–23)
CO2: 30 mmol/L (ref 22–32)
Calcium: 8 mg/dL — ABNORMAL LOW (ref 8.9–10.3)
Chloride: 101 mmol/L (ref 98–111)
Creatinine, Ser: 0.72 mg/dL (ref 0.61–1.24)
GFR, Estimated: >60 mL/min (ref >60)
Glucose, Bld: 129 mg/dL — ABNORMAL HIGH (ref 70–99)
Potassium: 3.8 mmol/L (ref 3.5–5.1)
Sodium: 138 mmol/L (ref 135–145)

## 2022-08-14 LAB — CBC
HCT: 31.5 % — ABNORMAL LOW (ref 39.0–52.0)
Hemoglobin: 10.5 g/dL — ABNORMAL LOW (ref 13.0–17.0)
MCH: 35.7 pg — ABNORMAL HIGH (ref 26.0–34.0)
MCHC: 33.3 g/dL (ref 30.0–36.0)
MCV: 107.1 fL — ABNORMAL HIGH (ref 80.0–100.0)
Platelets: 363 10*3/uL (ref 150–400)
RBC: 2.94 MIL/uL — ABNORMAL LOW (ref 4.22–5.81)
RDW: 12.6 % (ref 11.5–15.5)
WBC: 8.1 10*3/uL (ref 4.0–10.5)
nRBC: 0 % (ref 0.0–0.2)

## 2022-08-14 LAB — CULTURE, BLOOD (ROUTINE X 2): Special Requests: ADEQUATE

## 2022-08-14 LAB — GLUCOSE, CAPILLARY: Glucose-Capillary: 193 mg/dL — ABNORMAL HIGH (ref 70–99)

## 2022-08-14 LAB — MAGNESIUM: Magnesium: 1.8 mg/dL (ref 1.7–2.4)

## 2022-08-14 MED ORDER — AMOXICILLIN-POT CLAVULANATE 500-125 MG PO TABS: 1.0000 | ORAL_TABLET | Freq: Three times a day (TID) | ORAL | 0 refills | Status: AC

## 2022-08-14 MED ORDER — PREDNISONE 10 MG PO TABS: 40.0000 mg | ORAL_TABLET | Freq: Every day | ORAL | 0 refills | Status: AC

## 2022-08-14 NOTE — Discharge Summary (Signed)
Physician Discharge Summary  Dakota Patterson IHK:[id number] DOB: October 03, 1960 DOA: 08/11/2022  PCP: [facility] date: 08/11/2022  Discharge date: 08/14/2022  Admitted From:Home  Disposition:  Home with hospice  Recommendations for Outpatient Follow-up:  Follow up with PCP in 1-2 weeks Continue on Augmentin for a couple more days as prescribed for treatment of pneumonia Continue on prednisone as prescribed for 5 more days Continue other home medications as prior and reengage with home hospice care  Home Health: None  Equipment/Devices: None  Discharge Condition:Stable  CODE STATUS: DNR  Diet recommendation: Heart Healthy/carb modified  Brief/Interim Summary:  Dakota Patterson is a 62 y.o. male with medical history significant for stage IV lung cancer, hypertension, diabetes mellitus. Patient presented to the ED with complaints of difficulty breathing that started 4 days ago.  Patient was admitted with influenza A along with superimposed multifocal pneumonia.  He was empirically started on IV Rocephin and azithromycin.  He was maintained on scheduled breathing treatments as well as IV steroids on account of his wheezing and shortness of breath which had considerably improved and is back to baseline as of the day of discharge.  He was seen by palliative care and is active with home hospice services and is stable for discharge today.  Discharge Diagnoses:  Principal Problem:   Influenza A with pneumonia Active Problems:   Stage 4 lung cancer (HCC)   Severe sepsis ([place])   Alcoholism ([place])   HTN (hypertension)   COPD (chronic obstructive pulmonary disease) (HCC)   Diabetes (HCC)   Protein-calorie malnutrition, severe  Principal discharge diagnosis: Multifocal pneumonia superimposed on influenza A with mild COPD exacerbation.  Discharge Instructions  Discharge Instructions     Diet - low sodium heart healthy   Complete by: As directed     Increase activity slowly   Complete by: As directed       Allergies as of 08/14/2022       Reactions   Lisinopril Other (See Comments)   rash        Medication List     TAKE these medications    acetaminophen 500 MG tablet Commonly known as: TYLENOL Take 1,000 mg by mouth 2 (two) times daily.   albuterol (2.5 MG/3ML) 0.083% nebulizer solution Commonly known as: PROVENTIL Take 2.5 mg by nebulization every 8 (eight) hours as needed for wheezing or shortness of breath.   amoxicillin-clavulanate 500-125 MG tablet Commonly known as: Augmentin Take 1 tablet by mouth 3 (three) times daily for 2 days.   b complex vitamins capsule Take 1 capsule by mouth daily.   cyanocobalamin 500 MCG tablet Commonly known as: VITAMIN B12 Take 1,000 mcg by mouth daily.   ferrous gluconate 324 MG tablet Commonly known as: FERGON Take 324 mg by mouth daily.   folic acid 1 MG tablet Commonly known as: FOLVITE Take 1 mg by mouth daily.   Lantus SoloStar 100 UNIT/ML Solostar Pen Generic drug: insulin glargine Inject 10 Units into the skin every evening.   MAGnesium-Oxide 400 (240 Mg) MG tablet Generic drug: magnesium oxide Take 1 tablet by mouth daily.   metFORMIN 500 MG tablet Commonly known as: GLUCOPHAGE Take 500 mg by mouth 2 (two) times daily with a meal.   metoprolol tartrate 12.5 mg Tabs tablet Commonly known as: LOPRESSOR Take 0.5 tablets (12.5 mg total) by mouth 2 (two) times daily. What changed: how much to take   pravastatin 20 MG tablet Commonly known as: PRAVACHOL Take 20 mg  by mouth daily.   predniSONE 10 MG tablet Commonly known as: DELTASONE Take 4 tablets (40 mg total) by mouth daily for 5 days.   Symbicort 160-4.5 MCG/ACT inhaler Generic drug: budesonide-formoterol Inhale 2 puffs into the lungs 2 (two) times daily.   Temovate 0.05 % Generic drug: clobetasol cream Apply 1 Application topically 2 (two) times daily.   Tiotropium Bromide Monohydrate 2.5  MCG/ACT Aers Inhale 2 each into the lungs daily.   triamcinolone lotion 0.1 % Commonly known as: KENALOG Apply 1 Application topically as needed (itching).        Follow-up Information     [facility] Schedule an appointment as soon as possible for a visit in 1 week(s).   Contact information: [address] [place] [address] [phone]                Allergies  Allergen Reactions   Lisinopril Other (See Comments)    rash    Consultations: Palliative care   Procedures/Studies: CT Angio Chest PE W and/or Wo Contrast  Result Date: 08/11/2022 CLINICAL DATA:  High probability pulmonary embolism. EXAM: CT ANGIOGRAPHY CHEST WITH CONTRAST TECHNIQUE: Multidetector CT imaging of the chest was performed using the standard protocol during bolus administration of intravenous contrast. Multiplanar CT image reconstructions and MIPs were obtained to evaluate the vascular anatomy. RADIATION DOSE REDUCTION: This exam was performed according to the departmental dose-optimization program which includes automated exposure control, adjustment of the mA and/or kV according to patient size and/or use of iterative reconstruction technique. CONTRAST:  22mL OMNIPAQUE IOHEXOL 350 MG/ML SOLN COMPARISON:  None Available. FINDINGS: Cardiovascular: No filling defects within the pulmonary arteries to suggest acute pulmonary embolism. Coronary artery calcification and aortic atherosclerotic calcification. Mediastinum/Nodes: No mediastinal lymphadenopathy. Peribronchial thickening surrounding LEFT lower lobe bronchus extending into the LEFT lower lobe. Thickening measures up to 2.5 x 1.8 cm (image 51/6. Coronal imaging there is spiculation associated with this peribronchial thickening as well as narrowing of the LEFT lower lobe pulmonary artery lumen (image 79/9). The peripheral nodular consolidation in the RIGHT middle lobe and RIGHT lower lobe. Example consolidated nodule  measuring 1.2 cm on image 106/18. Ill-defined consolidation in the RIGHT middle lobe on image 109/8. A discrete RIGHT lower lobe nodule measuring 7 mm on image 108/8) Lungs/Pleura: Upper Abdomen: There is thickening of the LEFT adrenal gland scratch the there is thickening of the RIGHT adrenal gland 21.7 cm. RIGHT adrenal gland has soft tissue attenuation (HU equal 36). The liver has a nodular contour. Musculoskeletal: No aggressive osseous lesion. Review of the MIP images confirms the above findings. IMPRESSION: 1. No evidence acute pulmonary embolism. 2. Peribronchial thickening surrounding the LEFT lower lobe bronchus. Spiculated margin of the soft tissue thickening. Recommend FDG PET scan to evaluate for bronchogenic carcinoma. 3. Nodular consolidation in the RIGHT middle lobe and RIGHT lower lobe is concerning for multifocal pneumonia. 4. Nodular thickening of the RIGHT adrenal gland is indeterminate. Recommend characterization on above recommended FDG PET scan. 5. Nodular contour of the liver suggests cirrhosis. 6. Aortic atherosclerosis. Aortic Atherosclerosis (ICD10-I70.0). Electronically Signed   By: Suzy Bouchard M.D.   On: 08/11/2022 16:04   DG Chest Port 1 View  Result Date: 08/11/2022 CLINICAL DATA:  Shortness of breath, hypoxia, cancer EXAM: PORTABLE CHEST 1 VIEW COMPARISON:  Radiograph 10/02/2015 FINDINGS: Unchanged cardiomediastinal silhouette. Small nodular opacities in the right peripheral lower lung, new since April 2017. No large effusion or evidence of pneumothorax. Multiple chronic left upper rib  fractures. Thoracic spondylosis. IMPRESSION: Small nodular opacities in the right peripheral lower lung new since April 2017, could reflect an infectious/inflammatory process or possibly related to known malignancy. Electronically Signed   By: Maurine Simmering M.D.   On: 08/11/2022 15:06     Discharge Exam: Vitals:   08/14/22 0451 08/14/22 0824  BP: 93/63   Pulse: 87   Resp: 16   Temp: 98.2  F (36.8 C)   SpO2: 90% 95%   Vitals:   08/14/22 0108 08/14/22 0203 08/14/22 0451 08/14/22 0824  BP: (!) 150/76  93/63   Pulse: 84  87   Resp: 16  16   Temp: 97.8 F (36.6 C)  98.2 F (36.8 C)   TempSrc: Oral  Oral   SpO2: 99% 99% 90% 95%  Weight:      Height:        General: Pt is alert, awake, not in acute distress Cardiovascular: RRR, S1/S2 +, no rubs, no gallops Respiratory: CTA bilaterally, no wheezing, no rhonchi, nasal cannula oxygen Abdominal: Soft, NT, ND, bowel sounds + Extremities: no edema, no cyanosis    The results of significant diagnostics from this hospitalization (including imaging, microbiology, ancillary and laboratory) are listed below for reference.     Microbiology: Recent Results (from the past 240 hour(s))  Culture, blood (Routine x 2)     Status: None (Preliminary result)   Collection Time: 08/11/22  3:28 PM   Specimen: BLOOD RIGHT ARM  Result Value Ref Range Status   Specimen Description BLOOD RIGHT ARM  Final   Special Requests   Final    BOTTLES DRAWN AEROBIC AND ANAEROBIC Blood Culture adequate volume   Culture   Final    NO GROWTH 2 DAYS Performed at [facility], [address], [place], [place] [address]    Report Status PENDING  Incomplete  Culture, blood (Routine x 2)     Status: Abnormal   Collection Time: 08/11/22  3:28 PM   Specimen: BLOOD LEFT ARM  Result Value Ref Range Status   Specimen Description   Final    BLOOD LEFT ARM Performed at [facility], [address], [place], [place] [address]    Special Requests   Final    BOTTLES DRAWN AEROBIC AND ANAEROBIC Blood Culture adequate volume Performed at [facility], [address], [place], [place] [address]    Culture  Setup Time   Final    GRAM POSITIVE COCCI IN CLUSTERS BOTH Gram Stain Report Called to,Read Back By and Verified With: [place], M @ 0850 08/12/22 NIELSEN, S CRITICAL RESULT CALLED TO, READ BACK BY AND VERIFIED WITH: [place]  ON 08/12/22 @ 1439 BY  DRT    Culture (A)  Final    STAPHYLOCOCCUS CAPITIS THE SIGNIFICANCE OF ISOLATING THIS ORGANISM FROM A SINGLE SET OF BLOOD CULTURES WHEN MULTIPLE SETS ARE DRAWN IS UNCERTAIN. PLEASE NOTIFY THE MICROBIOLOGY DEPARTMENT WITHIN ONE WEEK IF SPECIATION AND SENSITIVITIES ARE REQUIRED. Performed at [facility], [place] [address] [place], [place] [address]    Report Status 08/14/2022 FINAL  Final  Blood Culture ID Panel (Reflexed)     Status: Abnormal   Collection Time: 08/11/22  3:28 PM  Result Value Ref Range Status   Enterococcus faecalis NOT DETECTED NOT DETECTED Final   Enterococcus Faecium NOT DETECTED NOT DETECTED Final   Listeria monocytogenes NOT DETECTED NOT DETECTED Final   Staphylococcus species DETECTED (A) NOT DETECTED Final    Comment: CRITICAL RESULT CALLED TO, READ  BACK BY AND VERIFIED WITH: PHARMD FRANK WILSON  ON 08/12/22 @ 1439 BY DRT    Staphylococcus aureus (BCID) NOT DETECTED NOT DETECTED Final   Staphylococcus epidermidis NOT DETECTED NOT DETECTED Final   Staphylococcus lugdunensis NOT DETECTED NOT DETECTED Final   Streptococcus species NOT DETECTED NOT DETECTED Final   Streptococcus agalactiae NOT DETECTED NOT DETECTED Final   Streptococcus pneumoniae NOT DETECTED NOT DETECTED Final   Streptococcus pyogenes NOT DETECTED NOT DETECTED Final   A.calcoaceticus-baumannii NOT DETECTED NOT DETECTED Final   Bacteroides fragilis NOT DETECTED NOT DETECTED Final   Enterobacterales NOT DETECTED NOT DETECTED Final   Enterobacter cloacae complex NOT DETECTED NOT DETECTED Final   Escherichia coli NOT DETECTED NOT DETECTED Final   Klebsiella aerogenes NOT DETECTED NOT DETECTED Final   Klebsiella oxytoca NOT DETECTED NOT DETECTED Final   Klebsiella pneumoniae NOT DETECTED NOT DETECTED Final   Proteus species NOT DETECTED NOT DETECTED Final   Salmonella species NOT DETECTED NOT DETECTED Final   Serratia marcescens NOT DETECTED NOT DETECTED Final   Haemophilus influenzae NOT  DETECTED NOT DETECTED Final   Neisseria meningitidis NOT DETECTED NOT DETECTED Final   Pseudomonas aeruginosa NOT DETECTED NOT DETECTED Final   Stenotrophomonas maltophilia NOT DETECTED NOT DETECTED Final   Candida albicans NOT DETECTED NOT DETECTED Final   Candida auris NOT DETECTED NOT DETECTED Final   Candida glabrata NOT DETECTED NOT DETECTED Final   Candida krusei NOT DETECTED NOT DETECTED Final   Candida parapsilosis NOT DETECTED NOT DETECTED Final   Candida tropicalis NOT DETECTED NOT DETECTED Final   Cryptococcus neoformans/gattii NOT DETECTED NOT DETECTED Final    Comment: Performed at [facility], [place] [address] [place], [place] [address]  Resp panel by RT-PCR (RSV, Flu A&B, Covid) Anterior Nasal Swab     Status: Abnormal   Collection Time: 08/11/22  3:37 PM   Specimen: Anterior Nasal Swab  Result Value Ref Range Status   SARS Coronavirus 2 by RT PCR NEGATIVE NEGATIVE Final    Comment: (NOTE) SARS-CoV-2 target nucleic acids are NOT DETECTED.  The SARS-CoV-2 RNA is generally detectable in upper respiratory specimens during the acute phase of infection. The lowest concentration of SARS-CoV-2 viral copies this assay can detect is 138 copies/mL. A negative result does not preclude SARS-Cov-2 infection and should not be used as the sole basis for treatment or other patient management decisions. A negative result may occur with  improper specimen collection/handling, submission of specimen other than nasopharyngeal swab, presence of viral mutation(s) within the areas targeted by this assay, and inadequate number of viral copies(<138 copies/mL). A negative result must be combined with clinical observations, patient history, and epidemiological information. The expected result is Negative.  Fact Sheet for Patients:  EntrepreneurPulse.com.au  Fact Sheet for Healthcare Providers:  IncredibleEmployment.be  This test is no t yet approved  or cleared by the [place] FDA and  has been authorized for detection and/or diagnosis of SARS-CoV-2 by FDA under an Emergency Use Authorization (EUA). This EUA will remain  in effect (meaning this test can be used) for the duration of the COVID-19 declaration under Section 564(b)(1) of the Act, 21 U.S.C.section 360bbb-3(b)(1), unless the authorization is terminated  or revoked sooner.       Influenza A by PCR NEGATIVE NEGATIVE Final   Influenza B by PCR POSITIVE (A) NEGATIVE Final    Comment: (NOTE) The Xpert Xpress SARS-CoV-2/FLU/RSV plus assay is intended as an aid in the diagnosis of influenza from Nasopharyngeal swab  specimens and should not be used as a sole basis for treatment. Nasal washings and aspirates are unacceptable for Xpert Xpress SARS-CoV-2/FLU/RSV testing.  Fact Sheet for Patients: EntrepreneurPulse.com.au  Fact Sheet for Healthcare Providers: IncredibleEmployment.be  This test is not yet approved or cleared by the [place] FDA and has been authorized for detection and/or diagnosis of SARS-CoV-2 by FDA under an Emergency Use Authorization (EUA). This EUA will remain in effect (meaning this test can be used) for the duration of the COVID-19 declaration under Section 564(b)(1) of the Act, 21 U.S.C. section 360bbb-3(b)(1), unless the authorization is terminated or revoked.     Resp Syncytial Virus by PCR NEGATIVE NEGATIVE Final    Comment: (NOTE) Fact Sheet for Patients: EntrepreneurPulse.com.au  Fact Sheet for Healthcare Providers: IncredibleEmployment.be  This test is not yet approved or cleared by the [place] FDA and has been authorized for detection and/or diagnosis of SARS-CoV-2 by FDA under an Emergency Use Authorization (EUA). This EUA will remain in effect (meaning this test can be used) for the duration of the COVID-19 declaration under Section 564(b)(1) of the  Act, 21 U.S.C. section 360bbb-3(b)(1), unless the authorization is terminated or revoked.  Performed at [facility], [address], [place], [place] [address]   Culture, blood (Routine X 2) w Reflex to ID Panel     Status: None (Preliminary result)   Collection Time: 08/12/22  3:49 PM   Specimen: Right Antecubital; Blood  Result Value Ref Range Status   Specimen Description RIGHT ANTECUBITAL  Final   Special Requests   Final    BOTTLES DRAWN AEROBIC AND ANAEROBIC Blood Culture adequate volume   Culture   Final    NO GROWTH < 12 HOURS Performed at [facility], [address], [place], [place] [address]    Report Status PENDING  Incomplete  Culture, blood (Routine X 2) w Reflex to ID Panel     Status: None (Preliminary result)   Collection Time: 08/12/22  3:49 PM   Specimen: BLOOD RIGHT HAND  Result Value Ref Range Status   Specimen Description BLOOD RIGHT HAND  Final   Special Requests   Final    BOTTLES DRAWN AEROBIC AND ANAEROBIC Blood Culture results may not be optimal due to an excessive volume of blood received in culture bottles   Culture   Final    NO GROWTH < 12 HOURS Performed at [facility], [address] [place],  [address]    Report Status PENDING  Incomplete     Labs: BNP (last 3 results) No results for input(s): "BNP" in the last 8760 hours. Basic Metabolic Panel: Recent Labs  Lab 08/11/22 1436 08/12/22 0415 08/13/22 0428 08/14/22 0414  NA 137 138 139 138  K 4.2 4.3 3.6 3.8  CL 98 109 107 101  CO2 24 21* 24 30  GLUCOSE 173* 131* 120* 129*  BUN 19 16 16 12   CREATININE 1.06 0.85 0.76 0.72  CALCIUM 9.4 7.7* 7.9* 8.0*  MG 1.6*  --  1.6* 1.8  PHOS 3.9  --   --   --    Liver Function Tests: Recent Labs  Lab 08/11/22 1436  AST 46*  ALT 24  ALKPHOS 89  BILITOT 0.6  PROT 8.3*  ALBUMIN 3.3*   No results for input(s): "LIPASE", "AMYLASE" in the last 168 hours. No results for input(s): "AMMONIA" in the last 168 hours. CBC: Recent Labs   Lab 08/11/22 1436 08/12/22 0415 08/13/22 0428 08/14/22 0414  WBC 14.1* 10.8*  11.3* 8.1  NEUTROABS 9.5*  --   --   --   HGB 14.0 10.9* 10.3* 10.5*  HCT 42.5 32.6* 32.0* 31.5*  MCV 107.3* 106.9* 108.8* 107.1*  PLT 450* 346 350 363   Cardiac Enzymes: No results for input(s): "CKTOTAL", "CKMB", "CKMBINDEX", "TROPONINI" in the last 168 hours. BNP: Invalid input(s): "POCBNP" CBG: Recent Labs  Lab 08/13/22 0756 08/13/22 1132 08/13/22 1648 08/13/22 2127 08/14/22 0728  GLUCAP 118* 137* 192* 279* 193*   D-Dimer No results for input(s): "DDIMER" in the last 72 hours. Hgb A1c Recent Labs    08/11/22 1436  HGBA1C 6.2*   Lipid Profile No results for input(s): "CHOL", "HDL", "LDLCALC", "TRIG", "CHOLHDL", "LDLDIRECT" in the last 72 hours. Thyroid function studies No results for input(s): "TSH", "T4TOTAL", "T3FREE", "THYROIDAB" in the last 72 hours.  Invalid input(s): "FREET3" Anemia work up No results for input(s): "VITAMINB12", "FOLATE", "FERRITIN", "TIBC", "IRON", "RETICCTPCT" in the last 72 hours. Urinalysis    Component Value Date/Time   COLORURINE YELLOW 08/11/2022 1650   APPEARANCEUR CLEAR 08/11/2022 1650   LABSPEC >1.046 (H) 08/11/2022 1650   PHURINE 5.0 08/11/2022 1650   GLUCOSEU NEGATIVE 08/11/2022 1650   HGBUR SMALL (A) 08/11/2022 1650   BILIRUBINUR NEGATIVE 08/11/2022 1650   KETONESUR NEGATIVE 08/11/2022 1650   PROTEINUR NEGATIVE 08/11/2022 1650   UROBILINOGEN 0.2 11/02/2013 1437   NITRITE NEGATIVE 08/11/2022 1650   LEUKOCYTESUR TRACE (A) 08/11/2022 1650   Sepsis Labs Recent Labs  Lab 08/11/22 1436 08/12/22 0415 08/13/22 0428 08/14/22 0414  WBC 14.1* 10.8* 11.3* 8.1   Microbiology Recent Results (from the past 240 hour(s))  Culture, blood (Routine x 2)     Status: None (Preliminary result)   Collection Time: 08/11/22  3:28 PM   Specimen: BLOOD RIGHT ARM  Result Value Ref Range Status   Specimen Description BLOOD RIGHT ARM  Final   Special  Requests   Final    BOTTLES DRAWN AEROBIC AND ANAEROBIC Blood Culture adequate volume   Culture   Final    NO GROWTH 2 DAYS Performed at [facility], [address], [place], [place] [address]    Report Status PENDING  Incomplete  Culture, blood (Routine x 2)     Status: Abnormal   Collection Time: 08/11/22  3:28 PM   Specimen: BLOOD LEFT ARM  Result Value Ref Range Status   Specimen Description   Final    BLOOD LEFT ARM Performed at [facility], [address] [place], [place] [address]    Special Requests   Final    BOTTLES DRAWN AEROBIC AND ANAEROBIC Blood Culture adequate volume Performed at [facility], [address], [place], [place] [address]    Culture  Setup Time   Final    GRAM POSITIVE COCCI IN CLUSTERS BOTH Gram Stain Report Called to,Read Back By and Verified With: [place], M @ 0850 08/12/22 NIELSEN, S CRITICAL RESULT CALLED TO, READ BACK BY AND VERIFIED WITH: [place]  ON 08/12/22 @ 1439 BY DRT    Culture (A)  Final    STAPHYLOCOCCUS CAPITIS THE SIGNIFICANCE OF ISOLATING THIS ORGANISM FROM A SINGLE SET OF BLOOD CULTURES WHEN MULTIPLE SETS ARE DRAWN IS UNCERTAIN. PLEASE NOTIFY THE MICROBIOLOGY DEPARTMENT WITHIN ONE WEEK IF SPECIATION AND SENSITIVITIES ARE REQUIRED. Performed at [facility], [place] [address] [place], [place] [address]    Report Status 08/14/2022 FINAL  Final  Blood Culture ID Panel (Reflexed)     Status: Abnormal   Collection Time: 08/11/22  3:28 PM  Result Value Ref Range Status   Enterococcus faecalis NOT DETECTED NOT DETECTED Final   Enterococcus Faecium NOT DETECTED NOT DETECTED Final   Listeria monocytogenes NOT DETECTED NOT DETECTED Final   Staphylococcus species DETECTED (A) NOT DETECTED Final    Comment: CRITICAL RESULT CALLED TO, READ BACK BY AND VERIFIED WITH: PHARMD FRANK WILSON  ON 08/12/22 @ 1439 BY DRT    Staphylococcus aureus (BCID) NOT DETECTED NOT DETECTED Final   Staphylococcus epidermidis NOT DETECTED NOT  DETECTED Final   Staphylococcus lugdunensis NOT DETECTED NOT DETECTED Final   Streptococcus species NOT DETECTED NOT DETECTED Final   Streptococcus agalactiae NOT DETECTED NOT DETECTED Final   Streptococcus pneumoniae NOT DETECTED NOT DETECTED Final   Streptococcus pyogenes NOT DETECTED NOT DETECTED Final   A.calcoaceticus-baumannii NOT DETECTED NOT DETECTED Final   Bacteroides fragilis NOT DETECTED NOT DETECTED Final   Enterobacterales NOT DETECTED NOT DETECTED Final   Enterobacter cloacae complex NOT DETECTED NOT DETECTED Final   Escherichia coli NOT DETECTED NOT DETECTED Final   Klebsiella aerogenes NOT DETECTED NOT DETECTED Final   Klebsiella oxytoca NOT DETECTED NOT DETECTED Final   Klebsiella pneumoniae NOT DETECTED NOT DETECTED Final   Proteus species NOT DETECTED NOT DETECTED Final   Salmonella species NOT DETECTED NOT DETECTED Final   Serratia marcescens NOT DETECTED NOT DETECTED Final   Haemophilus influenzae NOT DETECTED NOT DETECTED Final   Neisseria meningitidis NOT DETECTED NOT DETECTED Final   Pseudomonas aeruginosa NOT DETECTED NOT DETECTED Final   Stenotrophomonas maltophilia NOT DETECTED NOT DETECTED Final   Candida albicans NOT DETECTED NOT DETECTED Final   Candida auris NOT DETECTED NOT DETECTED Final   Candida glabrata NOT DETECTED NOT DETECTED Final   Candida krusei NOT DETECTED NOT DETECTED Final   Candida parapsilosis NOT DETECTED NOT DETECTED Final   Candida tropicalis NOT DETECTED NOT DETECTED Final   Cryptococcus neoformans/gattii NOT DETECTED NOT DETECTED Final    Comment: Performed at [facility], [address], [place], [place] [address]  Resp panel by RT-PCR (RSV, Flu A&B, Covid) Anterior Nasal Swab     Status: Abnormal   Collection Time: 08/11/22  3:37 PM   Specimen: Anterior Nasal Swab  Result Value Ref Range Status   SARS Coronavirus 2 by RT PCR NEGATIVE NEGATIVE Final    Comment: (NOTE) SARS-CoV-2 target nucleic acids are NOT  DETECTED.  The SARS-CoV-2 RNA is generally detectable in upper respiratory specimens during the acute phase of infection. The lowest concentration of SARS-CoV-2 viral copies this assay can detect is 138 copies/mL. A negative result does not preclude SARS-Cov-2 infection and should not be used as the sole basis for treatment or other patient management decisions. A negative result may occur with  improper specimen collection/handling, submission of specimen other than nasopharyngeal swab, presence of viral mutation(s) within the areas targeted by this assay, and inadequate number of viral copies(<138 copies/mL). A negative result must be combined with clinical observations, patient history, and epidemiological information. The expected result is Negative.  Fact Sheet for Patients:  EntrepreneurPulse.com.au  Fact Sheet for Healthcare Providers:  IncredibleEmployment.be  This test is no t yet approved or cleared by the [place] FDA and  has been authorized for detection and/or diagnosis of SARS-CoV-2 by FDA under an Emergency Use Authorization (EUA). This EUA will remain  in effect (meaning this test can be used) for the duration of the COVID-19 declaration under Section 564(b)(1) of the Act, 21 U.S.C.section 360bbb-3(b)(1), unless the authorization  is terminated  or revoked sooner.       Influenza A by PCR NEGATIVE NEGATIVE Final   Influenza B by PCR POSITIVE (A) NEGATIVE Final    Comment: (NOTE) The Xpert Xpress SARS-CoV-2/FLU/RSV plus assay is intended as an aid in the diagnosis of influenza from Nasopharyngeal swab specimens and should not be used as a sole basis for treatment. Nasal washings and aspirates are unacceptable for Xpert Xpress SARS-CoV-2/FLU/RSV testing.  Fact Sheet for Patients: EntrepreneurPulse.com.au  Fact Sheet for Healthcare Providers: IncredibleEmployment.be  This test is not  yet approved or cleared by the [place] FDA and has been authorized for detection and/or diagnosis of SARS-CoV-2 by FDA under an Emergency Use Authorization (EUA). This EUA will remain in effect (meaning this test can be used) for the duration of the COVID-19 declaration under Section 564(b)(1) of the Act, 21 U.S.C. section 360bbb-3(b)(1), unless the authorization is terminated or revoked.     Resp Syncytial Virus by PCR NEGATIVE NEGATIVE Final    Comment: (NOTE) Fact Sheet for Patients: EntrepreneurPulse.com.au  Fact Sheet for Healthcare Providers: IncredibleEmployment.be  This test is not yet approved or cleared by the [place] FDA and has been authorized for detection and/or diagnosis of SARS-CoV-2 by FDA under an Emergency Use Authorization (EUA). This EUA will remain in effect (meaning this test can be used) for the duration of the COVID-19 declaration under Section 564(b)(1) of the Act, 21 U.S.C. section 360bbb-3(b)(1), unless the authorization is terminated or revoked.  Performed at [facility], [address], [place], [place] [address]   Culture, blood (Routine X 2) w Reflex to ID Panel     Status: None (Preliminary result)   Collection Time: 08/12/22  3:49 PM   Specimen: Right Antecubital; Blood  Result Value Ref Range Status   Specimen Description RIGHT ANTECUBITAL  Final   Special Requests   Final    BOTTLES DRAWN AEROBIC AND ANAEROBIC Blood Culture adequate volume   Culture   Final    NO GROWTH < 12 HOURS Performed at [facility], [address] [place], [place] [address]    Report Status PENDING  Incomplete  Culture, blood (Routine X 2) w Reflex to ID Panel     Status: None (Preliminary result)   Collection Time: 08/12/22  3:49 PM   Specimen: BLOOD RIGHT HAND  Result Value Ref Range Status   Specimen Description BLOOD RIGHT HAND  Final   Special Requests   Final    BOTTLES DRAWN AEROBIC AND ANAEROBIC Blood  Culture results may not be optimal due to an excessive volume of blood received in culture bottles   Culture   Final    NO GROWTH < 12 HOURS Performed at [facility], [address] [place], [place] [address]    Report Status PENDING  Incomplete     Time coordinating discharge: 35 minutes  SIGNED:   Rodena Goldmann, DO [facility] 08/14/2022, 8:38 AM  If 7PM-7AM, please contact night-coverage [email or web address]

## 2022-08-14 NOTE — TOC Transition Note (Signed)
Transition of Care [facility]) - CM/SW Discharge Note   Patient Details  Name: Dakota Patterson MRN: [id number] Date of Birth: 1961-05-12  [facility] CM/SW Contact:  Salome Arnt, LCSW Phone Number: 08/14/2022, 8:53 AM   Clinical Narrative:  PT d/c today. LCSW spoke with pt's wife to confirm plan to follow up with [facility] hosp[facility]. No DME needs reported at this time. Wife is aware [facility] will evaluate today for hospice admission. Joelene Millin with [facility] hosp[facility] updated on d/c.      Final next level of care: Home w Hospice Care Barriers to Discharge: Barriers Resolved   Patient Goals and CMS Choice      Discharge Placement                    Name of family member notified: wife Patient and family notified of of transfer: 08/14/22  Discharge Plan and Services Additional resources added to the After Visit Summary for   In-house Referral: Clinical Social Work                                Representative spoke with at [place]: [place] Determinants of Health ([place]) Interventions SDOH Screenings   Food Insecurity: No Food Insecurity (08/11/2022)  Housing: Low Risk  (08/11/2022)  Transportation Needs: No Transportation Needs (08/11/2022)  Utilities: Not At Risk (08/11/2022)  Tobacco Use: High Risk (08/13/2022)     Readmission Risk Interventions     No data to display

## 2022-08-16 LAB — CULTURE, BLOOD (ROUTINE X 2)
Culture: NO GROWTH
Special Requests: ADEQUATE

## 2022-08-17 LAB — CULTURE, BLOOD (ROUTINE X 2)
Culture: NO GROWTH
Culture: NO GROWTH
Special Requests: ADEQUATE

## 2022-11-02 ENCOUNTER — Encounter (HOSPITAL_COMMUNITY): Payer: Self-pay | Admitting: Internal Medicine

## 2022-11-02 ENCOUNTER — Other Ambulatory Visit (HOSPITAL_COMMUNITY): Payer: Self-pay

## 2022-11-02 ENCOUNTER — Inpatient Hospital Stay (HOSPITAL_COMMUNITY): Payer: Medicaid Other

## 2022-11-02 ENCOUNTER — Inpatient Hospital Stay (HOSPITAL_COMMUNITY)
Admission: EM | Admit: 2022-11-02 | Discharge: 2022-11-05 | DRG: 871 | Disposition: A | Discharge status: Hospice | Payer: Medicaid Other | Attending: Family Medicine | Admitting: Family Medicine

## 2022-11-02 ENCOUNTER — Other Ambulatory Visit: Payer: Self-pay

## 2022-11-02 ENCOUNTER — Emergency Department (HOSPITAL_COMMUNITY): Payer: Medicaid Other

## 2022-11-02 DIAGNOSIS — Z1152 Encounter for screening for COVID-19: Secondary | ICD-10-CM | POA: Diagnosis not present

## 2022-11-02 DIAGNOSIS — R64 Cachexia: Secondary | ICD-10-CM | POA: Diagnosis present

## 2022-11-02 DIAGNOSIS — R6520 Severe sepsis without septic shock: Secondary | ICD-10-CM | POA: Diagnosis present

## 2022-11-02 DIAGNOSIS — M549 Dorsalgia, unspecified: Secondary | ICD-10-CM | POA: Diagnosis present

## 2022-11-02 DIAGNOSIS — Z681 Body mass index (BMI) 19 or less, adult: Secondary | ICD-10-CM

## 2022-11-02 DIAGNOSIS — I63413 Cerebral infarction due to embolism of bilateral middle cerebral arteries: Secondary | ICD-10-CM | POA: Diagnosis present

## 2022-11-02 DIAGNOSIS — L89212 Pressure ulcer of right hip, stage 2: Secondary | ICD-10-CM | POA: Diagnosis present

## 2022-11-02 DIAGNOSIS — I639 Cerebral infarction, unspecified: Secondary | ICD-10-CM

## 2022-11-02 DIAGNOSIS — J189 Pneumonia, unspecified organism: Secondary | ICD-10-CM | POA: Diagnosis present

## 2022-11-02 DIAGNOSIS — J9611 Chronic respiratory failure with hypoxia: Secondary | ICD-10-CM | POA: Diagnosis present

## 2022-11-02 DIAGNOSIS — I10 Essential (primary) hypertension: Secondary | ICD-10-CM | POA: Diagnosis present

## 2022-11-02 DIAGNOSIS — N179 Acute kidney failure, unspecified: Secondary | ICD-10-CM | POA: Diagnosis present

## 2022-11-02 DIAGNOSIS — E7800 Pure hypercholesterolemia, unspecified: Secondary | ICD-10-CM | POA: Diagnosis present

## 2022-11-02 DIAGNOSIS — J449 Chronic obstructive pulmonary disease, unspecified: Secondary | ICD-10-CM | POA: Diagnosis present

## 2022-11-02 DIAGNOSIS — Z9981 Dependence on supplemental oxygen: Secondary | ICD-10-CM

## 2022-11-02 DIAGNOSIS — Z7951 Long term (current) use of inhaled steroids: Secondary | ICD-10-CM

## 2022-11-02 DIAGNOSIS — Z66 Do not resuscitate: Secondary | ICD-10-CM | POA: Diagnosis present

## 2022-11-02 DIAGNOSIS — C3490 Malignant neoplasm of unspecified part of unspecified bronchus or lung: Secondary | ICD-10-CM | POA: Diagnosis present

## 2022-11-02 DIAGNOSIS — E872 Acidosis, unspecified: Secondary | ICD-10-CM | POA: Diagnosis present

## 2022-11-02 DIAGNOSIS — E119 Type 2 diabetes mellitus without complications: Secondary | ICD-10-CM | POA: Diagnosis present

## 2022-11-02 DIAGNOSIS — I6300 Cerebral infarction due to thrombosis of unspecified precerebral artery: Secondary | ICD-10-CM

## 2022-11-02 DIAGNOSIS — Z79899 Other long term (current) drug therapy: Secondary | ICD-10-CM

## 2022-11-02 DIAGNOSIS — D6869 Other thrombophilia: Secondary | ICD-10-CM | POA: Diagnosis present

## 2022-11-02 DIAGNOSIS — Z515 Encounter for palliative care: Secondary | ICD-10-CM | POA: Diagnosis not present

## 2022-11-02 DIAGNOSIS — Z833 Family history of diabetes mellitus: Secondary | ICD-10-CM

## 2022-11-02 DIAGNOSIS — E876 Hypokalemia: Secondary | ICD-10-CM | POA: Diagnosis present

## 2022-11-02 DIAGNOSIS — F17210 Nicotine dependence, cigarettes, uncomplicated: Secondary | ICD-10-CM | POA: Diagnosis present

## 2022-11-02 DIAGNOSIS — I63532 Cerebral infarction due to unspecified occlusion or stenosis of left posterior cerebral artery: Secondary | ICD-10-CM | POA: Diagnosis present

## 2022-11-02 DIAGNOSIS — R471 Dysarthria and anarthria: Secondary | ICD-10-CM | POA: Diagnosis present

## 2022-11-02 DIAGNOSIS — R531 Weakness: Secondary | ICD-10-CM | POA: Diagnosis present

## 2022-11-02 DIAGNOSIS — A419 Sepsis, unspecified organism: Secondary | ICD-10-CM | POA: Diagnosis present

## 2022-11-02 DIAGNOSIS — I6389 Other cerebral infarction: Secondary | ICD-10-CM | POA: Diagnosis not present

## 2022-11-02 DIAGNOSIS — G8101 Flaccid hemiplegia affecting right dominant side: Secondary | ICD-10-CM | POA: Diagnosis present

## 2022-11-02 DIAGNOSIS — K219 Gastro-esophageal reflux disease without esophagitis: Secondary | ICD-10-CM | POA: Diagnosis present

## 2022-11-02 DIAGNOSIS — R54 Age-related physical debility: Secondary | ICD-10-CM | POA: Diagnosis present

## 2022-11-02 DIAGNOSIS — J440 Chronic obstructive pulmonary disease with acute lower respiratory infection: Secondary | ICD-10-CM | POA: Diagnosis present

## 2022-11-02 DIAGNOSIS — R29810 Facial weakness: Secondary | ICD-10-CM | POA: Diagnosis present

## 2022-11-02 DIAGNOSIS — E43 Unspecified severe protein-calorie malnutrition: Secondary | ICD-10-CM | POA: Diagnosis present

## 2022-11-02 DIAGNOSIS — Z8701 Personal history of pneumonia (recurrent): Secondary | ICD-10-CM

## 2022-11-02 DIAGNOSIS — R29712 NIHSS score 12: Secondary | ICD-10-CM | POA: Diagnosis present

## 2022-11-02 DIAGNOSIS — Z85118 Personal history of other malignant neoplasm of bronchus and lung: Secondary | ICD-10-CM

## 2022-11-02 DIAGNOSIS — Z888 Allergy status to other drugs, medicaments and biological substances status: Secondary | ICD-10-CM

## 2022-11-02 DIAGNOSIS — F1021 Alcohol dependence, in remission: Secondary | ICD-10-CM | POA: Diagnosis present

## 2022-11-02 DIAGNOSIS — G893 Neoplasm related pain (acute) (chronic): Secondary | ICD-10-CM | POA: Diagnosis present

## 2022-11-02 DIAGNOSIS — F1020 Alcohol dependence, uncomplicated: Secondary | ICD-10-CM | POA: Diagnosis present

## 2022-11-02 LAB — CBC
HCT: 43.1 % (ref 39.0–52.0)
Hemoglobin: 13.7 g/dL (ref 13.0–17.0)
MCH: 34.3 pg — ABNORMAL HIGH (ref 26.0–34.0)
MCHC: 31.8 g/dL (ref 30.0–36.0)
MCV: 107.8 fL — ABNORMAL HIGH (ref 80.0–100.0)
Platelets: 228 10*3/uL (ref 150–400)
RBC: 4 MIL/uL — ABNORMAL LOW (ref 4.22–5.81)
RDW: 12.2 % (ref 11.5–15.5)
WBC: 26.1 10*3/uL — ABNORMAL HIGH (ref 4.0–10.5)
nRBC: 0 % (ref 0.0–0.2)

## 2022-11-02 LAB — LIPID PANEL
Cholesterol: 148 mg/dL (ref 0–200)
HDL: 55 mg/dL (ref >40)
LDL Cholesterol: 64 mg/dL (ref 0–99)
Total CHOL/HDL Ratio: 2.7 RATIO
Triglycerides: 145 mg/dL (ref <150)
VLDL: 29 mg/dL (ref 0–40)

## 2022-11-02 LAB — DIFFERENTIAL
Abs Immature Granulocytes: 0 10*3/uL (ref 0.00–0.07)
Band Neutrophils: 4 %
Basophils Absolute: 0 10*3/uL (ref 0.0–0.1)
Basophils Relative: 0 %
Eosinophils Absolute: 0 10*3/uL (ref 0.0–0.5)
Eosinophils Relative: 0 %
Lymphocytes Relative: 7 %
Lymphs Abs: 1.8 10*3/uL (ref 0.7–4.0)
Monocytes Absolute: 1 10*3/uL (ref 0.1–1.0)
Monocytes Relative: 4 %
Neutro Abs: 23.2 10*3/uL — ABNORMAL HIGH (ref 1.7–7.7)
Neutrophils Relative %: 85 %

## 2022-11-02 LAB — URINALYSIS, ROUTINE W REFLEX MICROSCOPIC
Bacteria, UA: NONE SEEN
Bilirubin Urine: NEGATIVE
Glucose, UA: NEGATIVE mg/dL
Hgb urine dipstick: NEGATIVE
Ketones, ur: NEGATIVE mg/dL
Nitrite: NEGATIVE
Protein, ur: NEGATIVE mg/dL
Specific Gravity, Urine: >1.046 — ABNORMAL HIGH (ref 1.005–1.030)
pH: 5 (ref 5.0–8.0)

## 2022-11-02 LAB — RAPID URINE DRUG SCREEN, HOSP PERFORMED
Amphetamines: NOT DETECTED
Barbiturates: NOT DETECTED
Benzodiazepines: NOT DETECTED
Cocaine: NOT DETECTED
Opiates: POSITIVE — AB
Tetrahydrocannabinol: NOT DETECTED

## 2022-11-02 LAB — COMPREHENSIVE METABOLIC PANEL
ALT: 21 U/L (ref 0–44)
AST: 52 U/L — ABNORMAL HIGH (ref 15–41)
Albumin: 2.5 g/dL — ABNORMAL LOW (ref 3.5–5.0)
Alkaline Phosphatase: 53 U/L (ref 38–126)
Anion gap: 16 — ABNORMAL HIGH (ref 5–15)
BUN: 28 mg/dL — ABNORMAL HIGH (ref 8–23)
CO2: 21 mmol/L — ABNORMAL LOW (ref 22–32)
Calcium: 8.5 mg/dL — ABNORMAL LOW (ref 8.9–10.3)
Chloride: 96 mmol/L — ABNORMAL LOW (ref 98–111)
Creatinine, Ser: 2.34 mg/dL — ABNORMAL HIGH (ref 0.61–1.24)
GFR, Estimated: 30 mL/min — ABNORMAL LOW (ref >60)
Glucose, Bld: 170 mg/dL — ABNORMAL HIGH (ref 70–99)
Potassium: 4.9 mmol/L (ref 3.5–5.1)
Sodium: 133 mmol/L — ABNORMAL LOW (ref 135–145)
Total Bilirubin: 0.8 mg/dL (ref 0.3–1.2)
Total Protein: 6.2 g/dL — ABNORMAL LOW (ref 6.5–8.1)

## 2022-11-02 LAB — HEMOGLOBIN A1C
Hgb A1c MFr Bld: 6 % — ABNORMAL HIGH (ref 4.8–5.6)
Mean Plasma Glucose: 125.5 mg/dL

## 2022-11-02 LAB — ETHANOL: Alcohol, Ethyl (B): <10 mg/dL (ref <10)

## 2022-11-02 LAB — RESP PANEL BY RT-PCR (RSV, FLU A&B, COVID)  RVPGX2
Influenza A by PCR: NEGATIVE
Influenza B by PCR: NEGATIVE
Resp Syncytial Virus by PCR: NEGATIVE
SARS Coronavirus 2 by RT PCR: NEGATIVE

## 2022-11-02 LAB — APTT: aPTT: 31 seconds (ref 24–36)

## 2022-11-02 LAB — PROTIME-INR
INR: 1.2 (ref 0.8–1.2)
Prothrombin Time: 14.9 seconds (ref 11.4–15.2)

## 2022-11-02 LAB — LACTIC ACID, PLASMA
Lactic Acid, Venous: 4.9 mmol/L (ref 0.5–1.9)
Lactic Acid, Venous: 3.8 mmol/L (ref 0.5–1.9)

## 2022-11-02 LAB — MRSA NEXT GEN BY PCR, NASAL: MRSA by PCR Next Gen: NOT DETECTED

## 2022-11-02 MED ORDER — SODIUM CHLORIDE 0.9 % IV SOLN: 2.0000 g | INTRAVENOUS | Status: DC

## 2022-11-02 MED ORDER — ACETAMINOPHEN 325 MG PO TABS: 650.0000 mg | ORAL_TABLET | Freq: Four times a day (QID) | ORAL | Status: DC | PRN

## 2022-11-02 MED ORDER — ALBUTEROL SULFATE HFA 108 (90 BASE) MCG/ACT IN AERS: 1.0000 | INHALATION_SPRAY | RESPIRATORY_TRACT | Status: DC | PRN

## 2022-11-02 MED ORDER — ONDANSETRON 4 MG PO TBDP: 4.0000 mg | ORAL_TABLET | ORAL | Status: DC | PRN

## 2022-11-02 MED ORDER — FOLIC ACID 1 MG PO TABS
1.0000 mg | ORAL_TABLET | Freq: Every day | ORAL | Status: DC
  Administered 2022-11-02 – 2022-11-05 (×4): 1 mg via ORAL
  Filled 2022-11-02 (×5): qty 1

## 2022-11-02 MED ORDER — METRONIDAZOLE 500 MG/100ML IV SOLN
500.0000 mg | Freq: Once | INTRAVENOUS | Status: AC
  Administered 2022-11-02: 500 mg via INTRAVENOUS
  Filled 2022-11-02: qty 100

## 2022-11-02 MED ORDER — ONDANSETRON HCL 4 MG PO TABS: 4.0000 mg | ORAL_TABLET | Freq: Four times a day (QID) | ORAL | Status: DC | PRN

## 2022-11-02 MED ORDER — TAMSULOSIN HCL 0.4 MG PO CAPS
0.4000 mg | ORAL_CAPSULE | Freq: Every day | ORAL | Status: DC
  Administered 2022-11-02 – 2022-11-05 (×4): 0.4 mg via ORAL
  Filled 2022-11-02 (×4): qty 1

## 2022-11-02 MED ORDER — VANCOMYCIN HCL IN DEXTROSE 1-5 GM/200ML-% IV SOLN
1000.0000 mg | Freq: Once | INTRAVENOUS | Status: AC
  Administered 2022-11-02: 1000 mg via INTRAVENOUS
  Filled 2022-11-02: qty 200

## 2022-11-02 MED ORDER — ACETAMINOPHEN 650 MG RE SUPP: 650.0000 mg | Freq: Four times a day (QID) | RECTAL | Status: DC | PRN

## 2022-11-02 MED ORDER — ACETAMINOPHEN 500 MG PO TABS: 1000.0000 mg | ORAL_TABLET | Freq: Two times a day (BID) | ORAL | Status: DC | PRN

## 2022-11-02 MED ORDER — HYDROMORPHONE HCL 1 MG/ML IJ SOLN
0.5000 mg | INTRAMUSCULAR | Status: DC | PRN
  Administered 2022-11-02 – 2022-11-04 (×7): 0.5 mg via INTRAVENOUS
  Filled 2022-11-02 (×7): qty 0.5

## 2022-11-02 MED ORDER — SODIUM CHLORIDE 0.9 % IV SOLN: INTRAVENOUS | Status: AC

## 2022-11-02 MED ORDER — ORAL CARE MOUTH RINSE: 15.0000 mL | OROMUCOSAL | Status: DC | PRN

## 2022-11-02 MED ORDER — TIOTROPIUM BROMIDE MONOHYDRATE 2.5 MCG/ACT IN AERS: 2.0000 | INHALATION_SPRAY | Freq: Every day | RESPIRATORY_TRACT | Status: DC

## 2022-11-02 MED ORDER — SODIUM CHLORIDE 0.9 % IV SOLN
2.0000 g | Freq: Once | INTRAVENOUS | Status: AC
  Administered 2022-11-02: 2 g via INTRAVENOUS
  Filled 2022-11-02: qty 12.5

## 2022-11-02 MED ORDER — ASPIRIN 81 MG PO TBEC
81.0000 mg | DELAYED_RELEASE_TABLET | Freq: Every day | ORAL | Status: DC
  Administered 2022-11-02 – 2022-11-05 (×4): 81 mg via ORAL
  Filled 2022-11-02 (×4): qty 1

## 2022-11-02 MED ORDER — UMECLIDINIUM BROMIDE 62.5 MCG/ACT IN AEPB
1.0000 | INHALATION_SPRAY | Freq: Every day | RESPIRATORY_TRACT | Status: DC
  Administered 2022-11-03 – 2022-11-05 (×3): 1 via RESPIRATORY_TRACT
  Filled 2022-11-02: qty 7

## 2022-11-02 MED ORDER — VANCOMYCIN HCL 750 MG/150ML IV SOLN: 750.0000 mg | INTRAVENOUS | Status: DC

## 2022-11-02 MED ORDER — ALBUTEROL SULFATE (2.5 MG/3ML) 0.083% IN NEBU
2.5000 mg | INHALATION_SOLUTION | Freq: Three times a day (TID) | RESPIRATORY_TRACT | Status: DC | PRN
  Administered 2022-11-03 – 2022-11-04 (×3): 2.5 mg via RESPIRATORY_TRACT
  Filled 2022-11-02 (×3): qty 3

## 2022-11-02 MED ORDER — TRAZODONE HCL 50 MG PO TABS
25.0000 mg | ORAL_TABLET | Freq: Once | ORAL | Status: AC
  Administered 2022-11-02: 25 mg via ORAL
  Filled 2022-11-02: qty 1

## 2022-11-02 MED ORDER — CHLORHEXIDINE GLUCONATE CLOTH 2 % EX PADS
6.0000 | MEDICATED_PAD | Freq: Every day | CUTANEOUS | Status: DC
  Administered 2022-11-02 – 2022-11-05 (×4): 6 via TOPICAL

## 2022-11-02 MED ORDER — ONDANSETRON HCL 4 MG/2ML IJ SOLN: 4.0000 mg | Freq: Four times a day (QID) | INTRAMUSCULAR | Status: DC | PRN

## 2022-11-02 MED ORDER — IOHEXOL 350 MG/ML SOLN
75.0000 mL | Freq: Once | INTRAVENOUS | Status: AC | PRN
  Administered 2022-11-02: 75 mL via INTRAVENOUS

## 2022-11-02 MED ORDER — SODIUM CHLORIDE 0.9 % IV BOLUS
2000.0000 mL | Freq: Once | INTRAVENOUS | Status: AC
  Administered 2022-11-02: 2000 mL via INTRAVENOUS

## 2022-11-02 MED ORDER — ORAL CARE MOUTH RINSE: 15.0000 mL | OROMUCOSAL | Status: DC

## 2022-11-02 NOTE — Progress Notes (Signed)
Telestroke RN Note  [id number]: Code stroke activated via cart. Patient presents to the ED with R sided weakness & slurred speech. LKW 2300.  9604: Page sent to Dr. Selina Cooley.  5409: Patient taken to CT.   1002: Dr. Pearlean Brownie virtually present on cart. MRS 3.   1012: Patient taken back to CT.   1032: Patient back in room from CT. Dr. Pearlean Brownie assessing patient. NIHSS 12.   1044: Signed off cart.

## 2022-11-02 NOTE — ED Notes (Signed)
Patient transported to MRI 

## 2022-11-02 NOTE — ED Notes (Signed)
Pt's wife states that he takes medication for his high HR as he has a Hx of high HR, but he did not take his medication today--DO made aware

## 2022-11-02 NOTE — Progress Notes (Signed)
[facility] Telemedicine Consult   Requesting Provider: Dr. Estell Harpin Consult Participants: Patient, his wife, atrium nurse and bedside nurse  location of the provider: [facility] Location of the patient: [facility] ED  This consult was provided via telemedicine with 2-way video and audio communication. The patient/family was informed that care would be provided in this way and agreed to receive care in this manner.    Chief Complaint: Slurred speech right hemiplegia  HPI: Dakota Patterson is a 62 year old Caucasian male with past medical history of stage IV lung cancer on hospice, alcoholism, hypertension, COPD, diabetes and protein calorie severe malnutrition.  Recent admission for multifocal pneumonia superimposed on influenza A with COPD exacerbation in February 2024.  Patient was last seen normal last night at 11 PM.  At 545 this morning he woke up with slurred speech and right hemiplegia.  His wife had trouble understanding him and felt he also could not understand her.  A code stroke was called. Patient was found to have dysarthria right facial droop and demonstrated me plegia with NIH stroke scale of 12.  His speech improved somewhat after return from CT scan but dense hemiplegia persisted  LKW: 11 PM last night tpa given?: No, outside window IR Thrombectomy? No, not an LVO Modified Rankin Scale: 3-Moderate disability-requires help but walks WITHOUT assistance Time of teleneurologist evaluation: 10 02  Exam: Vitals:   11/02/22 1000 11/02/22 1041  BP: 102/64   Pulse:  (!) 120  Resp:  20  SpO2:  (!) 74%    General: Frail cachectic malnourished looking middle-aged Caucasian male not in distress  1A: Level of Consciousness - 0 1B: Ask Month and Age - 62 1C: 'Blink Eyes' & 'Squeeze Hands' - 0 2: Test Horizontal Extraocular Movements - 0 3: Test Visual Fields - 0 4: Test Facial Palsy - 1 5A: Test Left Arm Motor Drift - 0 5B: Test Right Arm Motor Drift - 4 6A: Test  Left Leg Motor Drift - 0 6B: Test Right Leg Motor Drift - 4 7: Test Limb Ataxia - 0 8: Test Sensation - 1 9: Test Language/Aphasia- 0 10: Test Dysarthria - 1 11: Test Extinction/Inattention - 0 NIHSS score: 12  Patient is awake alert oriented to time place and person.  Mild dysarthria but can be understood.  Follows commands well.  No aphasia.  Extraocular movements full range without nystagmus.  Mild right lower facial asymmetry.  Tongue midline.  Motor system exam reveals dense flaccid right hemiplegia with more movement during treatment.Marland Kitchen  Purposeful antigravity movements on the left side.  Subjective diminished right face sensation.  Intact sensation in the extremities.  Gait not tested  Imaging Reviewed: CT head Noncon study shows no acute abnormality.  Aspects score of 10.  Mild changes of small vessel disease and generalized cerebral atrophy CT angiogram of the brain and neck shows no LVO large vessel occlusion. CT perfusion study which shows core close 0.  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 Patient has been limited outside time window for thrombolysis and clinical exam and CT angiogram does not support LVO Recommendations:  Admit to medical hospitalist service for stroke workup and treatment.  Vital signs and neurochecks as per stroke protocol Check  MRI scan of the brain, echocardiogram, lipid profile and hemoglobin A1c. DVT and GI prophylaxis. Aspirin for stroke prevention and aggressive risk factor modification. Telemetry neurology Dr. Melynda Ripple available for follow-up Long discussion with patient and wife at the bedside and answered questions. Discussed with Dr. Estell Harpin ER  MD    This patient is receiving care for possible acute neurological changes.  Patient is at risk for neurological worsening and extension of stroke.  Needs close neurological monitoring.  There was 60 minutes of care by this provider at the time of service, including time for direct evaluation via telemedicine, review of medical records, imaging studies and discussion of findings with providers, the patient and/or family.  Delia Heady, MD [facility] [phone]  If 7pm- 7am, please page neurology on call as listed in AMION.

## 2022-11-02 NOTE — Progress Notes (Signed)
Pharmacy Antibiotic Note  Bun Rinne is a 62 y.o. male admitted on 11/02/2022 with  unknown source of infection .  Pharmacy has been consulted for vancomycin and cefepime dosing.  Plan: Vancomycin 1000 mg iV x 1 dose. Vancomycin 750 mg IV every 48 hours. Cefepime 2000 mg IV every 24 hours. Monitor labs, c/s, and vanco levels as  indicated.  Height: 5\' 3"  (160 cm) Weight: 49.4 kg (108 lb 14.5 oz) IBW/kg (Calculated) : 56.9  Temp (24hrs), Avg:97.7 F (36.5 C), Min:97.7 F (36.5 C), Max:97.7 F (36.5 C)  Recent Labs  Lab 11/02/22 1117  WBC 26.1*  CREATININE 2.34*    Estimated Creatinine Clearance: 22.6 mL/min (A) (by C-G formula based on SCr of 2.34 mg/dL (H)).    Allergies  Allergen Reactions   Lisinopril Other (See Comments) and Rash    rash    Antimicrobials this admission: Vanco 5/6 >> Cefepime 5/6 >> Flagyl 5/6  Microbiology results: 5/6 BCx: pending   Thank you for allowing pharmacy to be a part of this patient's care.  Judeth Cornfield, PharmD Clinical Pharmacist 11/02/2022 12:02 PM

## 2022-11-02 NOTE — ED Triage Notes (Signed)
Pt arrived via [facility] c/o stroke-like Sx including slurred speech, R sided weakness and flaccidity. Last known well was lastnight at 11pm. Sx discovered when pt woke up this morning at 5:45am. MD at bedside, cbg in the 180s
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
## 2022-11-02 NOTE — ED Notes (Signed)
MD made aware of HR being in the 130s

## 2022-11-02 NOTE — ED Notes (Signed)
Pt has R bedsore on his R side hip area, per wife, hospice is treating that bedsore
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
## 2022-11-02 NOTE — ED Notes (Signed)
CODE STROKE called @ [phone].

## 2022-11-02 NOTE — ED Notes (Signed)
Both sets of blood cultures drawn before antibiotic administration  

## 2022-11-02 NOTE — Sepsis Progress Note (Signed)
Elink following code sepsis °

## 2022-11-02 NOTE — ED Notes (Signed)
Pt failed stroke swallow screen

## 2022-11-03 ENCOUNTER — Inpatient Hospital Stay (HOSPITAL_COMMUNITY): Payer: Medicaid Other

## 2022-11-03 ENCOUNTER — Encounter (HOSPITAL_COMMUNITY): Payer: Self-pay | Admitting: Internal Medicine

## 2022-11-03 DIAGNOSIS — I639 Cerebral infarction, unspecified: Secondary | ICD-10-CM | POA: Diagnosis not present

## 2022-11-03 DIAGNOSIS — Z515 Encounter for palliative care: Secondary | ICD-10-CM

## 2022-11-03 DIAGNOSIS — I6389 Other cerebral infarction: Secondary | ICD-10-CM

## 2022-11-03 LAB — CBC
HCT: 34 % — ABNORMAL LOW (ref 39.0–52.0)
Hemoglobin: 11.2 g/dL — ABNORMAL LOW (ref 13.0–17.0)
MCH: 34.8 pg — ABNORMAL HIGH (ref 26.0–34.0)
MCHC: 32.9 g/dL (ref 30.0–36.0)
MCV: 105.6 fL — ABNORMAL HIGH (ref 80.0–100.0)
Platelets: 254 10*3/uL (ref 150–400)
RBC: 3.22 MIL/uL — ABNORMAL LOW (ref 4.22–5.81)
RDW: 12 % (ref 11.5–15.5)
WBC: 24.1 10*3/uL — ABNORMAL HIGH (ref 4.0–10.5)
nRBC: 0 % (ref 0.0–0.2)

## 2022-11-03 LAB — ECHOCARDIOGRAM COMPLETE
AR max vel: 1.5 cm2
AV Area VTI: 2.16 cm2
AV Area mean vel: 1.73 cm2
AV Mean grad: 2.6 mmHg
AV Peak grad: 5.6 mmHg
Ao pk vel: 1.18 m/s
Area-P 1/2: 7.37 cm2
Height: 63 in
S' Lateral: 2.3 cm
Weight: 1541.46 oz

## 2022-11-03 LAB — BASIC METABOLIC PANEL
Anion gap: 10 (ref 5–15)
BUN: 23 mg/dL (ref 8–23)
CO2: 22 mmol/L (ref 22–32)
Calcium: 7.6 mg/dL — ABNORMAL LOW (ref 8.9–10.3)
Chloride: 103 mmol/L (ref 98–111)
Creatinine, Ser: 1.17 mg/dL (ref 0.61–1.24)
GFR, Estimated: >60 mL/min (ref >60)
Glucose, Bld: 78 mg/dL (ref 70–99)
Potassium: 3.6 mmol/L (ref 3.5–5.1)
Sodium: 135 mmol/L (ref 135–145)

## 2022-11-03 LAB — LACTIC ACID, PLASMA: Lactic Acid, Venous: 1.4 mmol/L (ref 0.5–1.9)

## 2022-11-03 LAB — GLUCOSE, CAPILLARY: Glucose-Capillary: 188 mg/dL — ABNORMAL HIGH (ref 70–99)

## 2022-11-03 LAB — CULTURE, BLOOD (ROUTINE X 2): Special Requests: ADEQUATE

## 2022-11-03 LAB — MAGNESIUM: Magnesium: 1.2 mg/dL — ABNORMAL LOW (ref 1.7–2.4)

## 2022-11-03 MED ORDER — MAGNESIUM SULFATE 2 GM/50ML IV SOLN
2.0000 g | Freq: Once | INTRAVENOUS | Status: AC
  Administered 2022-11-03: 2 g via INTRAVENOUS
  Filled 2022-11-03: qty 50

## 2022-11-03 MED ORDER — ENSURE ENLIVE PO LIQD
237.0000 mL | Freq: Two times a day (BID) | ORAL | Status: DC
  Administered 2022-11-04: 237 mL via ORAL

## 2022-11-03 MED ORDER — THIAMINE MONONITRATE 100 MG PO TABS
100.0000 mg | ORAL_TABLET | Freq: Every day | ORAL | Status: DC
  Administered 2022-11-04 – 2022-11-05 (×2): 100 mg via ORAL
  Filled 2022-11-03 (×2): qty 1

## 2022-11-03 MED ORDER — ORAL CARE MOUTH RINSE: 15.0000 mL | OROMUCOSAL | Status: DC | PRN

## 2022-11-03 MED ORDER — ADULT MULTIVITAMIN W/MINERALS CH
1.0000 | ORAL_TABLET | Freq: Every day | ORAL | Status: DC
  Administered 2022-11-04 – 2022-11-05 (×2): 1 via ORAL
  Filled 2022-11-03 (×2): qty 1

## 2022-11-03 MED ORDER — MELATONIN 3 MG PO TABS
6.0000 mg | ORAL_TABLET | Freq: Once | ORAL | Status: AC
  Administered 2022-11-03: 6 mg via ORAL
  Filled 2022-11-03: qty 2

## 2022-11-03 MED ORDER — SODIUM CHLORIDE 0.9 % IV SOLN
2.0000 g | Freq: Two times a day (BID) | INTRAVENOUS | Status: DC
  Administered 2022-11-03 – 2022-11-04 (×4): 2 g via INTRAVENOUS
  Filled 2022-11-03 (×4): qty 12.5

## 2022-11-03 MED ORDER — ORAL CARE MOUTH RINSE
15.0000 mL | OROMUCOSAL | Status: DC
  Administered 2022-11-03 – 2022-11-05 (×5): 15 mL via OROMUCOSAL

## 2022-11-03 NOTE — Progress Notes (Signed)
PROGRESS NOTE    Dakota Patterson  WUJ:[id number] DOB: 07-17-1960 DOA: 11/02/2022 PCP: [facility]   Brief Narrative:    Dakota Patterson is a 62 y.o. male with medical history significant for stage IV lung cancer on hospice, alcoholism, hypertension, COPD with chronic hypoxemia on 3 L nasal cannula, diabetes, and severe protein calorie malnutrition who presented to the ED with slurred speech and right-sided hemiplegia.  He was admitted with acute left MCA distribution CVA as well as severe sepsis secondary to right-sided pneumonia with associated AKI.  Neurology evaluation pending.  Assessment & Plan:   Principal Problem:   CVA (cerebral vascular accident) (HCC) Active Problems:   Acute kidney injury (HCC)   Stage 4 lung cancer (HCC)   Severe sepsis (HCC)   Alcoholism (HCC)   HTN (hypertension)   COPD (chronic obstructive pulmonary disease) (HCC)   Diabetes (HCC)   Protein-calorie malnutrition, severe  Assessment and Plan:   Acute CVA left MCA distribution Appreciate neurology evaluation MRI confirms 2D echocardiogram pending LDL 64 and A1c 6% Aspirin for stroke prophylaxis Dysphagia 3 diet per SLP PT/OT evaluation pending   Severe sepsis likely secondary to right-sided pneumonia Continue antibiotics with cefepime and vancomycin Monitor CBC and lactic acidosis Continue aggressive IV fluid hydration   AKI likely secondary to above Baseline creatinine 0.7-0.8 Continue IV fluid hydration Strict I's and O's Avoid nephrotoxic agents Monitor repeat labs   Stage IV lung cancer on hospice Currently under hospice care Palliative to follow-up   COPD DuoNebs as needed No acute bronchospasms currently   Hypertension Hold home blood pressure agents in the setting of sepsis physiology as well as CVA Labetalol as needed for severe elevations over 220/110 Currently with tachycardia, plan to resume metoprolol once okay with neurology   Type 2  diabetes SSI A1c 6%   Alcoholism Appears to have stopped alcohol use   DVT prophylaxis: SCDs Code Status: DNR Family Communication: Wife at bedside 5/7 Disposition Plan: Continue treatment for sepsis/CVA Status is: Inpatient Remains inpatient appropriate because: Ongoing need for IV antibiotics  Consultants:  Neurology Palliative care  Procedures:  See below  Antimicrobials:  Anti-infectives (From admission, onward)    Start     Dose/Rate Route Frequency Ordered Stop   11/04/22 1200  vancomycin (VANCOREADY) IVPB 750 mg/150 mL        750 mg 150 mL/hr over 60 Minutes Intravenous Every 48 hours 11/02/22 1200     11/03/22 1200  ceFEPIme (MAXIPIME) 2 g in sodium chloride 0.9 % 100 mL IVPB  Status:  Discontinued        2 g 200 mL/hr over 30 Minutes Intravenous Every 24 hours 11/02/22 1156 11/03/22 0858   11/03/22 1000  ceFEPIme (MAXIPIME) 2 g in sodium chloride 0.9 % 100 mL IVPB        2 g 200 mL/hr over 30 Minutes Intravenous Every 12 hours 11/03/22 0858     11/02/22 1200  ceFEPIme (MAXIPIME) 2 g in sodium chloride 0.9 % 100 mL IVPB        2 g 200 mL/hr over 30 Minutes Intravenous  Once 11/02/22 1146 11/02/22 1256   11/02/22 1200  metroNIDAZOLE (FLAGYL) IVPB 500 mg        500 mg 100 mL/hr over 60 Minutes Intravenous  Once 11/02/22 1146 11/02/22 1325   11/02/22 1200  vancomycin (VANCOCIN) IVPB 1000 mg/200 mL premix        1,000 mg 200 mL/hr over 60 Minutes Intravenous  Once  11/02/22 1146 11/02/22 1412      Subjective: Patient seen and evaluated today with no new acute complaints or concerns. No acute concerns or events noted overnight.  Continues to be tachycardic and wife is at bedside.  Objective: Vitals:   11/03/22 0815 11/03/22 0900 11/03/22 1000 11/03/22 1007  BP:   (!) 154/125 (!) 150/127  Pulse: (!) 132 (!) 131 (!) 139 (!) 140  Resp: 18 (!) 23 (!) 29 (!) 28  Temp: 99 F (37.2 C)     TempSrc: Oral     SpO2: 96% 96% 91% 91%  Weight:      Height:         Intake/Output Summary (Last 24 hours) at 11/03/2022 1043 Last data filed at 11/03/2022 1032 Gross per 24 hour  Intake 1771.56 ml  Output 450 ml  Net 1321.56 ml   Filed Weights   11/02/22 0946 11/02/22 1455  Weight: 49.4 kg 43.7 kg    Examination:  General exam: Appears calm and comfortable  Respiratory system: Clear to auscultation. Respiratory effort normal.  3 L nasal cannula Cardiovascular system: S1 & S2 heard, RRR.  Tachycardic Gastrointestinal system: Abdomen is soft Central nervous system: Alert and awake Extremities: No edema Skin: No significant lesions noted Psychiatry: Flat affect.    Data Reviewed: I have personally reviewed following labs and imaging studies  CBC: Recent Labs  Lab 11/02/22 1117 11/03/22 0523  WBC 26.1* 24.1*  NEUTROABS 23.2*  --   HGB 13.7 11.2*  HCT 43.1 34.0*  MCV 107.8* 105.6*  PLT 228 254   Basic Metabolic Panel: Recent Labs  Lab 11/02/22 1117 11/03/22 0523  NA 133* 135  K 4.9 3.6  CL 96* 103  CO2 21* 22  GLUCOSE 170* 78  BUN 28* 23  CREATININE 2.34* 1.17  CALCIUM 8.5* 7.6*  MG  --  1.2*   GFR: Estimated Creatinine Clearance: 39.9 mL/min (by C-G formula based on SCr of 1.17 mg/dL). Liver Function Tests: Recent Labs  Lab 11/02/22 1117  AST 52*  ALT 21  ALKPHOS 53  BILITOT 0.8  PROT 6.2*  ALBUMIN 2.5*   No results for input(s): "LIPASE", "AMYLASE" in the last 168 hours. No results for input(s): "AMMONIA" in the last 168 hours. Coagulation Profile: Recent Labs  Lab 11/02/22 1035  INR 1.2   Cardiac Enzymes: No results for input(s): "CKTOTAL", "CKMB", "CKMBINDEX", "TROPONINI" in the last 168 hours. BNP (last 3 results) No results for input(s): "PROBNP" in the last 8760 hours. HbA1C: Recent Labs    11/02/22 1341  HGBA1C 6.0*   CBG: Recent Labs  Lab 11/02/22 0947  GLUCAP 188*   Lipid Profile: Recent Labs    11/02/22 1342  CHOL 148  HDL 55  LDLCALC 64  TRIG 145  CHOLHDL 2.7   Thyroid  Function Tests: No results for input(s): "TSH", "T4TOTAL", "FREET4", "T3FREE", "THYROIDAB" in the last 72 hours. Anemia Panel: No results for input(s): "VITAMINB12", "FOLATE", "FERRITIN", "TIBC", "IRON", "RETICCTPCT" in the last 72 hours. Sepsis Labs: Recent Labs  Lab 11/02/22 1216 11/02/22 1410 11/03/22 0523  LATICACIDVEN 4.9* 3.8* 1.4    Recent Results (from the past 240 hour(s))  Blood Culture (routine x 2)     Status: None (Preliminary result)   Collection Time: 11/02/22 12:00 PM   Specimen: BLOOD  Result Value Ref Range Status   Specimen Description BLOOD BLOOD LEFT ARM  Final   Special Requests   Final    BOTTLES DRAWN AEROBIC AND ANAEROBIC Blood Culture adequate  volume   Culture   Final    NO GROWTH < 24 HOURS Performed at [facility], [address], [place], [place] [address]    Report Status PENDING  Incomplete  Blood Culture (routine x 2)     Status: None (Preliminary result)   Collection Time: 11/02/22 12:16 PM   Specimen: BLOOD  Result Value Ref Range Status   Specimen Description BLOOD BLOOD LEFT HAND  Final   Special Requests   Final    BOTTLES DRAWN AEROBIC AND ANAEROBIC Blood Culture adequate volume   Culture   Final    NO GROWTH < 24 HOURS Performed at [facility], [address] [place], [place] [address]    Report Status PENDING  Incomplete  Resp panel by RT-PCR (RSV, Flu A&B, Covid) Anterior Nasal Swab     Status: None   Collection Time: 11/02/22  1:17 PM   Specimen: Anterior Nasal Swab  Result Value Ref Range Status   SARS Coronavirus 2 by RT PCR NEGATIVE NEGATIVE Final    Comment: (NOTE) SARS-CoV-2 target nucleic acids are NOT DETECTED.  The SARS-CoV-2 RNA is generally detectable in upper respiratory specimens during the acute phase of infection. The lowest concentration of SARS-CoV-2 viral copies this assay can detect is 138 copies/mL. A negative result does not preclude SARS-Cov-2 infection and should not be used as the sole basis for  treatment or other patient management decisions. A negative result may occur with  improper specimen collection/handling, submission of specimen other than nasopharyngeal swab, presence of viral mutation(s) within the areas targeted by this assay, and inadequate number of viral copies(<138 copies/mL). A negative result must be combined with clinical observations, patient history, and epidemiological information. The expected result is Negative.  Fact Sheet for Patients:  BloggerCourse.com  Fact Sheet for Healthcare Providers:  SeriousBroker.it  This test is no t yet approved or cleared by the [place] FDA and  has been authorized for detection and/or diagnosis of SARS-CoV-2 by FDA under an Emergency Use Authorization (EUA). This EUA will remain  in effect (meaning this test can be used) for the duration of the COVID-19 declaration under Section 564(b)(1) of the Act, 21 U.S.C.section 360bbb-3(b)(1), unless the authorization is terminated  or revoked sooner.       Influenza A by PCR NEGATIVE NEGATIVE Final   Influenza B by PCR NEGATIVE NEGATIVE Final    Comment: (NOTE) The Xpert Xpress SARS-CoV-2/FLU/RSV plus assay is intended as an aid in the diagnosis of influenza from Nasopharyngeal swab specimens and should not be used as a sole basis for treatment. Nasal washings and aspirates are unacceptable for Xpert Xpress SARS-CoV-2/FLU/RSV testing.  Fact Sheet for Patients: BloggerCourse.com  Fact Sheet for Healthcare Providers: SeriousBroker.it  This test is not yet approved or cleared by the [place] FDA and has been authorized for detection and/or diagnosis of SARS-CoV-2 by FDA under an Emergency Use Authorization (EUA). This EUA will remain in effect (meaning this test can be used) for the duration of the COVID-19 declaration under Section 564(b)(1) of the Act, 21  U.S.C. section 360bbb-3(b)(1), unless the authorization is terminated or revoked.     Resp Syncytial Virus by PCR NEGATIVE NEGATIVE Final    Comment: (NOTE) Fact Sheet for Patients: BloggerCourse.com  Fact Sheet for Healthcare Providers: SeriousBroker.it  This test is not yet approved or cleared by the [place] FDA and has been authorized for detection and/or diagnosis of SARS-CoV-2 by FDA under an Emergency Use Authorization (EUA). This EUA will  remain in effect (meaning this test can be used) for the duration of the COVID-19 declaration under Section 564(b)(1) of the Act, 21 U.S.C. section 360bbb-3(b)(1), unless the authorization is terminated or revoked.  Performed at [facility], [address] [place], [place] [address]   MRSA Next Gen by PCR, Nasal     Status: None   Collection Time: 11/02/22  3:00 PM   Specimen: Nasal Mucosa; Nasal Swab  Result Value Ref Range Status   MRSA by PCR Next Gen NOT DETECTED NOT DETECTED Final    Comment: (NOTE) The GeneXpert MRSA Assay (FDA approved for NASAL specimens only), is one component of a comprehensive MRSA colonization surveillance program. It is not intended to diagnose MRSA infection nor to guide or monitor treatment for MRSA infections. Test performance is not FDA approved in patients less than 68 years old. Performed at [facility], [address] [place], [place] [address]          Radiology Studies: MR BRAIN WO CONTRAST  Result Date: 11/02/2022 CLINICAL DATA:  Altered mental status EXAM: MRI HEAD WITHOUT CONTRAST TECHNIQUE: Multiplanar, multiecho pulse sequences of the brain and surrounding structures were obtained without intravenous contrast. COMPARISON:  Same-day CT head FINDINGS: Brain: There are scattered small areas of diffusion restriction in both cerebral hemispheres in the bilateral MCA and left PCA distributions, most notably in the left MCA distribution in  the precentral gyrus. There is associated FLAIR signal abnormality but no hemorrhage or mass effect. Findings are consistent with small acute infarcts, possibly embolic in etiology. There is no acute intracranial hemorrhage or extra-axial fluid collection. Background parenchymal volume is within expected limits for age. The ventricles are normal in size. FLAIR signal abnormality in the periventricular white matter likely reflects underlying chronic small-vessel ischemic change, mild for age. The pituitary and suprasellar region are grossly unremarkable. There is no mass lesion. There is no mass effect or midline shift. Vascular: Normal flow voids. Skull and upper cervical spine: Normal marrow signal. Sinuses/Orbits: The paranasal sinuses are clear. The globes and orbits are unremarkable. Other: None. IMPRESSION: Scattered acute infarcts in the bilateral MCA and left PCA distributions without hemorrhage or mass effect, most notably in the left MCA distribution. Findings may be embolic in etiology. Electronically Signed   By: Lesia Hausen M.D.   On: 11/02/2022 14:57   DG Chest Port 1 View  Result Date: 11/02/2022 CLINICAL DATA:  Sepsis. EXAM: PORTABLE CHEST 1 VIEW COMPARISON:  August 11, 2022. FINDINGS: The heart size and mediastinal contours are within normal limits. Old left rib fractures are noted. Right infrahilar opacity is now noted concerning for pneumonia. Mild interstitial densities are noted throughout both lungs which may represent scarring. IMPRESSION: Right infrahilar opacity is noted concerning for pneumonia. Followup PA and lateral chest X-ray is recommended in 3-4 weeks following trial of antibiotic therapy to ensure resolution and exclude underlying malignancy. Aortic Atherosclerosis (ICD10-I70.0). Electronically Signed   By: Lupita Raider M.D.   On: 11/02/2022 12:34   CT ANGIO HEAD NECK W WO CM W PERF (CODE STROKE)  Result Date: 11/02/2022 CLINICAL DATA:  Transient ischemic attack (TIA)  EXAM: CT ANGIOGRAPHY HEAD AND NECK CT PERFUSION BRAIN TECHNIQUE: Multidetector CT imaging of the head and neck was performed using the standard protocol during bolus administration of intravenous contrast. Multiplanar CT image reconstructions and MIPs were obtained to evaluate the vascular anatomy. Carotid stenosis measurements (when applicable) are obtained utilizing NASCET criteria, using the distal internal carotid diameter as the denominator.  Multiphase CT imaging of the brain was performed following IV bolus contrast injection. Subsequent parametric perfusion maps were calculated using RAPID software. RADIATION DOSE REDUCTION: This exam was performed according to the departmental dose-optimization program which includes automated exposure control, adjustment of the mA and/or kV according to patient size and/or use of iterative reconstruction technique. CONTRAST:  75mL OMNIPAQUE IOHEXOL 350 MG/ML SOLN COMPARISON:  Same day CT head FINDINGS: CT HEAD FINDINGS See same day CT brain for intracranial findings. CTA NECK FINDINGS Aortic arch: Standard branching. There is severe atherosclerotic plaque of the aortic arch with at least moderate to severe stenosis of the origins of the left common carotid left subclavian artery. Right carotid system: No evidence of dissection, stenosis (50% or greater) or occlusion. Left carotid system: There is a large amount of mixed calcified and noncalcified atherosclerotic plaque of the carotid bifurcation on the left. There is mild (less than 50%) narrowing of the origin left ICA. Vertebral arteries: Right dominant. There is moderate narrowing of the origin of the left vertebral artery. Skeleton: Negative Other neck: Negative. Upper chest: Compared to recent prior CT of the chest dated 08/11/2022, there are new consolidative airspace opacities in the right upper lobe, worrisome for infection. There is background of severe centrilobular emphysema. Review of the MIP images confirms the  above findings CTA HEAD FINDINGS Anterior circulation: No significant stenosis, proximal occlusion, aneurysm, or vascular malformation. Atherosclerotic plaque of the carotid siphons bilaterally. Posterior circulation: The basilar artery is diffusely small in caliber. There are fetal PCAs bilaterally. Venous sinuses: As permitted by contrast timing, patent. Anatomic variants: Fetal PCAs bilaterally. Review of the MIP images confirms the above findings CT Brain Perfusion Findings: ASPECTS: 10 CBF (<30%) Volume: 0mL Perfusion (Tmax>6.0s) volume: 5mL Mismatch Volume: 5mL Infarction Location:No infarct. Perfusion abnormality is localized to the region of the left post-central gyrus. IMPRESSION: 1. No emergent large vessel occlusion. 2. No infarct by CT perfusion. 5 ml perfusion abnormality is localized to the region of the left post-central gyrus. 3. Severe atherosclerotic plaque of the aortic arch with moderate to severe stenosis of the origins of the left common carotid and left subclavian arteries. 4. Large amount of mixed calcified and noncalcified atherosclerotic plaque of the left carotid bifurcation with mild (less than 50%) narrowing of the origin of the left ICA. 5. Compared to recent prior CT of the chest dated 08/11/2022, there are new consolidative airspace opacities in the right upper lobe, worrisome for infection. Recommend further evaluation with a dedicated CT of the chest Findings were paged to Dr. Pearlean Brownie on 11/02/22 at 10:58 AM. Aortic Atherosclerosis (ICD10-I70.0) and Emphysema (ICD10-J43.9). Electronically Signed   By: Lorenza Cambridge M.D.   On: 11/02/2022 10:58   CT HEAD CODE STROKE WO CONTRAST  Result Date: 11/02/2022 CLINICAL DATA:  Code stroke. Stroke suspected. Right-sided weakness EXAM: CT HEAD WITHOUT CONTRAST TECHNIQUE: Contiguous axial images were obtained from the base of the skull through the vertex without intravenous contrast. RADIATION DOSE REDUCTION: This exam was performed according to  the departmental dose-optimization program which includes automated exposure control, adjustment of the mA and/or kV according to patient size and/or use of iterative reconstruction technique. COMPARISON:  None Available. FINDINGS: Brain: No evidence of acute infarction, hemorrhage, hydrocephalus, extra-axial collection or mass lesion/mass effect. Vascular: No hyperdense vessel or unexpected calcification. Skull: Normal. Negative for fracture or focal lesion. Sinuses/Orbits: No middle ear or mastoid effusion. paranasal sinuses are clear. Orbits are unremarkable. Other: None. ASPECTS [facility] Stroke Program Early CT Score): 10  IMPRESSION: No hemorrhage or CT evidence of an acute infarct.  Aspects is 10. Findings were discussed with Dr. Estell Harpin on 11/02/22 at 10:02 AM. Electronically Signed   By: Lorenza Cambridge M.D.   On: 11/02/2022 10:03        Scheduled Meds:  aspirin EC  81 mg Oral Daily   Chlorhexidine Gluconate Cloth  6 each Topical Daily   folic acid  1 mg Oral Daily   tamsulosin  0.4 mg Oral Daily   umeclidinium bromide  1 puff Inhalation Daily   Continuous Infusions:  ceFEPime (MAXIPIME) IV 2 g (11/03/22 1020)   [START ON 11/04/2022] vancomycin       LOS: 1 day    Time spent: 35 minutes    Alejandria Wessells Hoover Brunette, DO [facility]  If 7PM-7AM, please contact night-coverage [email or web address] 11/03/2022, 10:43 AM

## 2022-11-03 NOTE — Plan of Care (Signed)
  Problem: Acute Rehab OT Goals (only OT should resolve) Goal: Pt. Will Perform Eating Flowsheets (Taken 11/03/2022 1002) Pt Will Perform Eating:  with min guard assist  sitting  with set-up Goal: Pt. Will Perform Grooming Flowsheets (Taken 11/03/2022 1002) Pt Will Perform Grooming:  with min assist  with min guard assist  sitting Goal: Pt. Will Perform Upper Body Bathing Flowsheets (Taken 11/03/2022 1002) Pt Will Perform Upper Body Bathing:  with min assist  sitting Goal: Pt. Will Perform Upper Body Dressing Flowsheets (Taken 11/03/2022 1002) Pt Will Perform Upper Body Dressing:  with min assist  sitting Goal: Pt. Will Perform Lower Body Dressing Flowsheets (Taken 11/03/2022 1002) Pt Will Perform Lower Body Dressing:  with mod assist  sitting/lateral leans  with adaptive equipment Goal: Pt. Will Transfer To Toilet Flowsheets (Taken 11/03/2022 1002) Pt Will Transfer to Toilet:  with min assist  stand pivot transfer Goal: Pt. Will Perform Toileting-Clothing Manipulation Flowsheets (Taken 11/03/2022 1002) Pt Will Perform Toileting - Clothing Manipulation and hygiene:  with mod assist  sitting/lateral leans  with adaptive equipment Goal: Pt/Caregiver Will Perform Home Exercise Program Flowsheets (Taken 11/03/2022 1002) Pt/caregiver will Perform Home Exercise Program:  Increased ROM  Increased strength  Right Upper extremity  Left upper extremity  With minimal assist  Preciosa Bundrick OT, MOT

## 2022-11-03 NOTE — Evaluation (Signed)
Occupational Therapy Evaluation Patient Details Name: Dakota Patterson MRN: [id number] DOB: [birth year] Today's Date: 11/03/2022   History of Present Illness Dakota Patterson is a 62 y.o. male with medical history significant for stage IV lung cancer on hospice, alcoholism, hypertension, COPD with chronic hypoxemia on 3 L nasal cannula, diabetes, and severe protein calorie malnutrition who presented to the ED with slurred speech and right-sided hemiplegia at around 5:45 AM this morning.  He was last seen normal at 11 PM last night.  His wife had trouble understanding his speech and therefore EMS was called.  Code stroke was called in the ED and he continues to have dense hemiplegia, however his speech appears to have improved.  He has had an ongoing cough with congestion, but with no worsening shortness of breath or wheezing, fevers, or chills. (Per DO)   Clinical Impression   Pt agreeable to OT and PT co-evaluation. Pt was independent for ADL's at baseline with ambulation using a cane PRN. Today pt required max A for bed mobility and stand pivots to chair without the RW due to inability to use R UE functionally. Pt presented with flaccid R UE and is R hand dominant. Mild deficits in visual tracking in superior quadrants but no visual field deficits noted. Pt is requiring significantly more assist than prior to stroke. Family will need to be educated on hospice versus rehab and make a final decision. SNF recommended pending family/patient decision on plan of care. Pt will benefit from continued OT in the hospital and recommended venue below to increase strength, balance, and endurance for safe ADL's.   '      Recommendations for follow up therapy are one component of a multi-disciplinary discharge planning process, led by the attending physician.  Recommendations may be updated based on patient status, additional functional criteria and insurance authorization.   Assistance Recommended at Discharge  Frequent or constant Supervision/Assistance  Patient can return home with the following A lot of help with walking and/or transfers;A lot of help with bathing/dressing/bathroom;Assistance with cooking/housework;Assistance with feeding;Direct supervision/assist for medications management;Assist for transportation;Help with stairs or ramp for entrance    Functional Status Assessment  Patient has had a recent decline in their functional status and/or demonstrates limited ability to make significant improvements in function in a reasonable and predictable amount of time  Equipment Recommendations  None recommended by OT           Precautions / Restrictions Precautions Precautions: Fall Restrictions Weight Bearing Restrictions: No      Mobility Bed Mobility Overal bed mobility: Needs Assistance Bed Mobility: Rolling, Sidelying to Sit Rolling: Mod assist, Max assist Sidelying to sit: Max assist       General bed mobility comments: Mod to max A to roll to R side for peri-care. Max A to come to sitting at EOB. R lateral lean.    Transfers Overall transfer level: Needs assistance Equipment used: Rolling walker (2 wheels) (attempted but did not functionally use.) Transfers: Sit to/from Stand, Bed to chair/wheelchair/BSC Sit to Stand: Max assist Stand pivot transfers: Max assist         General transfer comment: Pt has limited funciton of R LE and no active use of R UE. Much assist needed to pivot to the chair from bed. Unable to use RW.      Balance Overall balance assessment: Needs assistance Sitting-balance support: Feet supported, No upper extremity supported Sitting balance-Leahy Scale: Poor Sitting balance - Comments: poor to fair seated at EOB Postural control:  Right lateral lean, Posterior lean Standing balance support: Bilateral upper extremity supported, During functional activity (Assisted by PT)                               ADL either performed or  assessed with clinical judgement   ADL Overall ADL's : Needs assistance/impaired Eating/Feeding: Moderate assistance;Bed level   Grooming: Moderate assistance;Bed level   Upper Body Bathing: Moderate assistance;Bed level   Lower Body Bathing: Total assistance;Bed level   Upper Body Dressing : Moderate assistance;Bed level   Lower Body Dressing: Total assistance;Bed level   Toilet Transfer: Maximal assistance;Stand-pivot;BSC/3in1 Toilet Transfer Details (indicate cue type and reason): EOB to BSC and back with max A. Toileting- Clothing Manipulation and Hygiene: Total assistance;Sit to/from stand;Maximal assistance Toileting - Clothing Manipulation Details (indicate cue type and reason): Pt assisted in standing by PT while this therapist completed peri-care for pt.             Vision Baseline Vision/History: 1 Wears glasses Ability to See in Adequate Light: 1 Impaired Patient Visual Report: No change from baseline Vision Assessment?: Yes Tracking/Visual Pursuits: Other (comment);Decreased smoothness of eye movement to LEFT superior field;Decreased smoothness of eye movement to RIGHT superior field Visual Fields: No apparent deficits                Pertinent Vitals/Pain Pain Assessment Pain Assessment: No/denies pain     Hand Dominance Right   Extremity/Trunk Assessment Upper Extremity Assessment Upper Extremity Assessment: RUE deficits/detail;LUE deficits/detail RUE Deficits / Details: R UE is completely flaccid with no active movement. WFL P/ROM. No tone noted. RUE Coordination: decreased fine motor;decreased gross motor LUE Deficits / Details: Generally weak. LUE Coordination: WNL   Lower Extremity Assessment Lower Extremity Assessment: Defer to PT evaluation   Cervical / Trunk Assessment Cervical / Trunk Assessment: Normal   Communication Communication Communication: No difficulties   Cognition Arousal/Alertness: Awake/alert Behavior During Therapy: WFL  for tasks assessed/performed Overall Cognitive Status: Impaired/Different from baseline Area of Impairment: Orientation                 Orientation Level: Place, Time                   General Comments   Red and slightly swollen R UE.                Home Living Family/patient expects to be discharged to:: Private residence Living Arrangements: Children;Spouse/significant other Available Help at Discharge: Family;Available 24 hours/day Type of Home: House Home Access: Stairs to enter [employer] of Steps: 3-4 Entrance Stairs-Rails: Right;Left;Can reach both Home Layout: One level     Bathroom Shower/Tub: Producer, television/film/video: Standard Bathroom Accessibility: Yes   Home Equipment: Cane - single point;Shower seat (Has BSC and w/c being delivered by hospice.)   Additional Comments: Has home O2 equipment      Prior Functioning/Environment Prior Level of Function : Needs assist       Physical Assist : ADLs (physical)   ADLs (physical): IADLs Mobility Comments: Uses cane for ambulation PRN. ADLs Comments: Independent ADL; assist for IADL's.        OT Problem List: Decreased strength;Decreased range of motion;Decreased activity tolerance;Impaired UE functional use;Impaired balance (sitting and/or standing);Impaired vision/perception;Impaired tone;Impaired sensation      OT Treatment/Interventions: Self-care/ADL training;Therapeutic exercise;Therapeutic activities    OT Goals(Current goals can be found in the care plan section) Acute Rehab  OT Goals Patient Stated Goal: Make decision on hospice verus rehab. Family reports in hospice care at this time. OT Goal Formulation: With family Time For Goal Achievement: 11/17/22 Potential to Achieve Goals: Fair  OT Frequency: Min 2X/week    Co-evaluation PT/OT/SLP Co-Evaluation/Treatment: Yes Reason for Co-Treatment: Complexity of the patient's impairments (multi-system involvement)    OT goals addressed during session: ADL's and self-care                       End of Session Equipment Utilized During Treatment: Oxygen  Activity Tolerance: Patient tolerated treatment well Patient left: in chair;with call bell/phone within reach;with chair alarm set;with family/visitor present  OT Visit Diagnosis: Unsteadiness on feet (R26.81);Other abnormalities of gait and mobility (R26.89);Muscle weakness (generalized) (M62.81);Hemiplegia and hemiparesis;Other symptoms and signs involving the nervous system (R29.898) Hemiplegia - Right/Left: Right Hemiplegia - dominant/non-dominant: Dominant Hemiplegia - caused by: Cerebral infarction                Time: 4098-1191 OT Time Calculation (min): 32 min Charges:  OT General Charges $OT Visit: 1 Visit OT Evaluation $OT Eval Low Complexity: 1 Low  Kersti Scavone OT, MOT  Danie Chandler 11/03/2022, 9:58 AM
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
## 2022-11-03 NOTE — Evaluation (Signed)
Physical Therapy Evaluation Patient Details Name: Dakota Patterson MRN: [id number] DOB: 1961-04-15 Today's Date: 11/03/2022  History of Present Illness  Dakota Patterson is a 62 y.o. male with medical history significant for stage IV lung cancer on hospice, alcoholism, hypertension, COPD with chronic hypoxemia on 3 L nasal cannula, diabetes, and severe protein calorie malnutrition who presented to the ED with slurred speech and right-sided hemiplegia at around 5:45 AM this morning.  He was last seen normal at 11 PM last night.  His wife had trouble understanding his speech and therefore EMS was called.  Code stroke was called in the ED and he continues to have dense hemiplegia, however his speech appears to have improved.  He has had an ongoing cough with congestion, but with no worsening shortness of breath or wheezing, fevers, or chills.   Clinical Impression  Patient demonstrates slow labored movement for sitting up at bedside with limited use of right side due to weakness, required Max assist for transfers and limited to a couple fo shuffling side steps during transfer to [facility], unable to grip with right hand or maintain standing balance on RLE due to weakness.  Patient and family request to continue home hospice care with comfort measures only.  Plan:  Patient discharged from physical therapy to care of nursing for out of bed daily as tolerated for length of stay.         Recommendations for follow up therapy are one component of a multi-disciplinary discharge planning process, led by the attending physician.  Recommendations may be updated based on patient status, additional functional criteria and insurance authorization.  Follow Up Recommendations       Assistance Recommended at Discharge Intermittent Supervision/Assistance  Patient can return home with the following  A lot of help with bathing/dressing/bathroom;A lot of help with walking and/or transfers;Assistance with cooking/housework;Help  with stairs or ramp for entrance    Equipment Recommendations Wheelchair (measurements PT);Wheelchair cushion (measurements PT)  Recommendations for Other Services       Functional Status Assessment Patient has had a recent decline in their functional status and demonstrates the ability to make significant improvements in function in a reasonable and predictable amount of time.     Precautions / Restrictions Precautions Precautions: Fall Restrictions Weight Bearing Restrictions: No      Mobility  Bed Mobility Overal bed mobility: Needs Assistance Bed Mobility: Rolling, Sidelying to Sit Rolling: Mod assist, Max assist Sidelying to sit: Max assist       General bed mobility comments: slow labored movement with no functional use of RUE due to weakness    Transfers Overall transfer level: Needs assistance Equipment used: Rolling walker (2 wheels) Transfers: Sit to/from Stand, Bed to chair/wheelchair/BSC Sit to Stand: Max assist Stand pivot transfers: Max assist         General transfer comment: patient unable to use RUE to hold onto walker due to weakness, had to put most of body weight on LLE during transfer to chair, BSC    Ambulation/Gait Ambulation/Gait assistance: Max assist Gait Distance (Feet): 2 Feet Assistive device: 1 person hand held assist, Rolling walker (2 wheels) Gait Pattern/deviations: Decreased step length - right, Decreased stance time - right, Decreased step length - left, Decreased stride length, Knees buckling, Decreased dorsiflexion - right, Shuffle Gait velocity: slow     General Gait Details: limited to a couple of shuffling side steps with poor return for weightbearing on RLE due to weakness, buckling of knee  Stairs  Wheelchair Mobility    Modified Rankin (Stroke Patients Only)       Balance Overall balance assessment: Needs assistance Sitting-balance support: Feet supported, No upper extremity supported Sitting  balance-Leahy Scale: Poor Sitting balance - Comments: poor to fair seated at EOB Postural control: Right lateral lean, Posterior lean Standing balance support: During functional activity, Single extremity supported Standing balance-Leahy Scale: Poor Standing balance comment: leaning on RW with left hand                             Pertinent Vitals/Pain Pain Assessment Pain Assessment: No/denies pain    Home Living Family/patient expects to be discharged to:: Private residence Living Arrangements: Children;Spouse/significant other Available Help at Discharge: Family;Available 24 hours/day Type of Home: House Home Access: Stairs to enter Entrance Stairs-Rails: Right;Left;Can reach both Entrance Stairs-Number of Steps: 3-4   Home Layout: One level Home Equipment: Cane - single point;Shower seat Additional Comments: Has home O2 equipment    Prior Function Prior Level of Function : Needs assist       Physical Assist : ADLs (physical)   ADLs (physical): IADLs Mobility Comments: Uses cane for ambulation PRN. ADLs Comments: Independent ADL; assist for IADL's.     Hand Dominance   Dominant Hand: Right    Extremity/Trunk Assessment   Upper Extremity Assessment Upper Extremity Assessment: Defer to OT evaluation RUE Deficits / Details: R UE is completely flaccid with no active movement. WFL P/ROM. No tone noted. RUE Coordination: decreased fine motor;decreased gross motor LUE Deficits / Details: Generally weak. LUE Coordination: WNL    Lower Extremity Assessment Lower Extremity Assessment: Generalized weakness;RLE deficits/detail RLE Deficits / Details: grossly 2/5, except ankle dorsiflexion 1/5 RLE Sensation: decreased light touch;decreased proprioception RLE Coordination: decreased fine motor;decreased gross motor    Cervical / Trunk Assessment Cervical / Trunk Assessment: Normal  Communication   Communication: No difficulties  Cognition  Arousal/Alertness: Awake/alert Behavior During Therapy: WFL for tasks assessed/performed Overall Cognitive Status: Impaired/Different from baseline Area of Impairment: Orientation                 Orientation Level: Place, Time                      General Comments      Exercises     Assessment/Plan    PT Assessment Patient does not need any further PT services (family/patient desires comfort measures only, on hospice care at home)  PT Problem List         PT Treatment Interventions      PT Goals (Current goals can be found in the Care Plan section)  Acute Rehab PT Goals Patient Stated Goal: return home with family to assist PT Goal Formulation: With patient/family Time For Goal Achievement: 11/03/22 Potential to Achieve Goals: Good    Frequency       Co-evaluation PT/OT/SLP Co-Evaluation/Treatment: Yes Reason for Co-Treatment: Complexity of the patient's impairments (multi-system involvement);To address functional/ADL transfers PT goals addressed during session: Mobility/safety with mobility;Balance;Proper use of DME OT goals addressed during session: ADL's and self-care       AM-PAC PT "6 Clicks" Mobility  Outcome Measure Help needed turning from your back to your side while in a flat bed without using bedrails?: A Lot Help needed moving from lying on your back to sitting on the side of a flat bed without using bedrails?: A Lot Help needed moving to and from a bed to  a chair (including a wheelchair)?: A Lot Help needed standing up from a chair using your arms (e.g., wheelchair or bedside chair)?: A Lot Help needed to walk in hospital room?: Total Help needed climbing 3-5 steps with a railing? : Total 6 Click Score: 10    End of Session Equipment Utilized During Treatment: Oxygen Activity Tolerance: Patient tolerated treatment well;Patient limited by fatigue Patient left: in chair;with call bell/phone within reach Nurse Communication: Mobility  status PT Visit Diagnosis: Unsteadiness on feet (R26.81);Other abnormalities of gait and mobility (R26.89);Muscle weakness (generalized) (M62.81)    Time: 1610-9604 PT Time Calculation (min) (ACUTE ONLY): 23 min   Charges:   PT Evaluation $PT Eval Moderate Complexity: 1 Mod PT Treatments $Therapeutic Activity: 23-37 mins        11:44 AM, 11/03/22 Ocie Bob, MPT Physical Therapist with [facility] [phone] office [phone] mobile phone

## 2022-11-03 NOTE — Progress Notes (Signed)
Initial Nutrition Assessment  DOCUMENTATION CODES:   Underweight  INTERVENTION:  Provide Ensure Enlive po BID, each supplement provides 350 kcal and 20 grams of protein.  Monitor magnesium, potassium, and phosphorus daily for at least 3 days, MD to replete as needed, as pt is at risk for refeeding syndrome.  Continue folic acid 1 mg daily. Also provide multivitamin with minerals daily and thiamine 100 mg daily.  NUTRITION DIAGNOSIS:   Increased nutrient needs related to catabolic illness (COPD, stage IV lung cancer) as evidenced by estimated needs.  GOAL:   Patient will meet greater than or equal to 90% of their needs  MONITOR:   PO intake, Supplement acceptance, Labs, Weight trends, I & O's, Skin  REASON FOR ASSESSMENT:   Malnutrition Screening Tool    ASSESSMENT:   62 year old male with PMHx of DM, HTN, GERD, stage IV lung cancer on hospice, EtOH abuse, COPD with chronic hypoxemia on 3 L nasal cannula, severe protein calorie malnutrition who presented with slurred speech and right-sided hemiplegia found to have acute left MCA distribution CVA, also with severe sepsis secondary to right-sided PNA with AKI.  5/6: ordered for dysphagia 3 diet with thin liquids s/p SLP evaluation  RD working remotely. Attempted to reach patient/family over the phone by calling into room but unable to reach. Also attempted to call patient's wife over the phone but unable to reach. Pt admitted with acute CVA. Approved for dysphagia 3 diet with thin liquids by SLP.  No meal documentation available at this time. Noted pt with low BMI. Pt is at risk for malnutrition and noted documentation of history of severe protein calorie malnutrition per H&P. Suspect pt may be at risk for refeeding syndrome. Recommend monitoring potassium, phosphorus, and magnesium daily x at least 3 days and replacing as needed. Will order oral nutrition supplements for pt. Noted per review of chart pt is from home with [facility] and will plan to return home with continuation of hospice services at discharge.  Per review of wt history in chart pt was 62.4 kg on 09/28/2015. He was 49.4 kg on 08/11/22. Over the past 3 months, pt has lost 5.7 kg or 11.5% weight, which is significant for time frame. Since 2017 pt has lost 18.7 kg or 30% weight.  Medications reviewed and include: folic acid 1 mg daily, Flomax, cefepime  Labs reviewed: Magnesium 1.2  UOP: 450 mL UOP previous 24 hours  I/O: +1271.6 mL since admisison  NUTRITION - FOCUSED PHYSICAL EXAM:  Unable to complete as RD working remotely  Diet Order:   Diet Order             DIET DYS 3 Room service appropriate? Yes; Fluid consistency: Thin  Diet effective now                  EDUCATION NEEDS:   No education needs have been identified at this time  Skin:  Skin Assessment: Skin Integrity Issues: Skin Integrity Issues:: Stage II Stage II: right hip (0.5 x 0.5 cm)  Last BM:  11/03/22 - large type 6  Height:   Ht Readings from Last 1 Encounters:  11/02/22 5\' 3"  (1.6 m)   Weight:   Wt Readings from Last 1 Encounters:  11/02/22 43.7 kg   Ideal Body Weight:  56.4 kg  BMI:  Body mass index is 17.07 kg/m.  Estimated Nutritional Needs:   Kcal:  1400-1600  Protein:  70-80 grams  Fluid:  1.4-1.6 L/day  Stormey Wilborn  Tollie Eth, MS, RD, LDN, CNSC Pager number available on Amion
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
## 2022-11-03 NOTE — Progress Notes (Signed)
  Echocardiogram 2D Echocardiogram has been performed.  Dakota Patterson 11/03/2022, 9:16 AM

## 2022-11-03 NOTE — Plan of Care (Signed)
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
## 2022-11-04 ENCOUNTER — Inpatient Hospital Stay (HOSPITAL_COMMUNITY): Payer: Medicaid Other

## 2022-11-04 DIAGNOSIS — I639 Cerebral infarction, unspecified: Secondary | ICD-10-CM | POA: Diagnosis not present

## 2022-11-04 LAB — MAGNESIUM: Magnesium: 1.6 mg/dL — ABNORMAL LOW (ref 1.7–2.4)

## 2022-11-04 LAB — CBC
HCT: 32.9 % — ABNORMAL LOW (ref 39.0–52.0)
Hemoglobin: 11.2 g/dL — ABNORMAL LOW (ref 13.0–17.0)
MCH: 34.7 pg — ABNORMAL HIGH (ref 26.0–34.0)
MCHC: 34 g/dL (ref 30.0–36.0)
MCV: 101.9 fL — ABNORMAL HIGH (ref 80.0–100.0)
Platelets: 274 10*3/uL (ref 150–400)
RBC: 3.23 MIL/uL — ABNORMAL LOW (ref 4.22–5.81)
RDW: 11.9 % (ref 11.5–15.5)
WBC: 17.3 10*3/uL — ABNORMAL HIGH (ref 4.0–10.5)
nRBC: 0 % (ref 0.0–0.2)

## 2022-11-04 LAB — BASIC METABOLIC PANEL
Anion gap: 8 (ref 5–15)
BUN: 17 mg/dL (ref 8–23)
CO2: 23 mmol/L (ref 22–32)
Calcium: 7.9 mg/dL — ABNORMAL LOW (ref 8.9–10.3)
Chloride: 104 mmol/L (ref 98–111)
Creatinine, Ser: 0.82 mg/dL (ref 0.61–1.24)
GFR, Estimated: >60 mL/min (ref >60)
Glucose, Bld: 95 mg/dL (ref 70–99)
Potassium: 3.1 mmol/L — ABNORMAL LOW (ref 3.5–5.1)
Sodium: 135 mmol/L (ref 135–145)

## 2022-11-04 LAB — CULTURE, BLOOD (ROUTINE X 2): Special Requests: ADEQUATE

## 2022-11-04 LAB — PHOSPHORUS: Phosphorus: 1.9 mg/dL — ABNORMAL LOW (ref 2.5–4.6)

## 2022-11-04 MED ORDER — OXYCODONE HCL 5 MG PO TABS
5.0000 mg | ORAL_TABLET | ORAL | Status: DC | PRN
  Administered 2022-11-04 – 2022-11-05 (×3): 5 mg via ORAL
  Filled 2022-11-04 (×3): qty 1

## 2022-11-04 MED ORDER — MORPHINE SULFATE ER 15 MG PO TBCR
30.0000 mg | EXTENDED_RELEASE_TABLET | Freq: Two times a day (BID) | ORAL | Status: DC
  Administered 2022-11-04 – 2022-11-05 (×3): 30 mg via ORAL
  Filled 2022-11-04 (×3): qty 2

## 2022-11-04 MED ORDER — K PHOS MONO-SOD PHOS DI & MONO 155-852-130 MG PO TABS
500.0000 mg | ORAL_TABLET | Freq: Three times a day (TID) | ORAL | Status: AC
  Administered 2022-11-04 (×3): 500 mg via ORAL
  Filled 2022-11-04 (×4): qty 2

## 2022-11-04 MED ORDER — TRAZODONE HCL 50 MG PO TABS
50.0000 mg | ORAL_TABLET | Freq: Every day | ORAL | Status: DC
  Administered 2022-11-04: 50 mg via ORAL
  Filled 2022-11-04: qty 1

## 2022-11-04 MED ORDER — MAGNESIUM SULFATE 2 GM/50ML IV SOLN
2.0000 g | Freq: Once | INTRAVENOUS | Status: AC
  Administered 2022-11-04: 2 g via INTRAVENOUS
  Filled 2022-11-04: qty 50

## 2022-11-04 MED ORDER — K PHOS MONO-SOD PHOS DI & MONO 155-852-130 MG PO TABS
500.0000 mg | ORAL_TABLET | Freq: Once | ORAL | Status: AC
  Administered 2022-11-04: 500 mg via ORAL
  Filled 2022-11-04: qty 2

## 2022-11-04 MED ORDER — POTASSIUM CHLORIDE CRYS ER 20 MEQ PO TBCR
40.0000 meq | EXTENDED_RELEASE_TABLET | Freq: Once | ORAL | Status: AC
  Administered 2022-11-04: 40 meq via ORAL
  Filled 2022-11-04: qty 2

## 2022-11-04 MED ORDER — METOPROLOL TARTRATE 25 MG PO TABS
25.0000 mg | ORAL_TABLET | Freq: Two times a day (BID) | ORAL | Status: DC
  Administered 2022-11-04 – 2022-11-05 (×3): 25 mg via ORAL
  Filled 2022-11-04 (×3): qty 1

## 2022-11-04 NOTE — Progress Notes (Signed)
OT Cancellation Note  Patient Details Name: Dakota Patterson MRN: [id number] DOB: 07/15/1960   Cancelled Treatment:    Reason Eval/Treat Not Completed: Other (comment). Pt will be removed from OT list based on information that pt is returning to hospice care. Thank you.   Devanta Daniel OT, MOT   Danie Chandler 11/04/2022, 9:49 AM

## 2022-11-04 NOTE — Progress Notes (Signed)
SLP Cancellation Note  Patient Details Name: Dakota Patterson MRN: [id number] DOB: 27-Jun-1961   Cancelled treatment:       Reason Eval/Treat Not Completed: Other (comment). Pt is not hungry and politely declined PO trials; note Pt will be going discharging with Hospice. There are no further ST needs at this time, our service will sign off. Thank you,  Abdikadir Fohl H. Romie Levee, CCC-SLP Speech Language Pathologist   Georgetta Haber 11/04/2022, 5:19 PM

## 2022-11-04 NOTE — Progress Notes (Signed)
[facility] PROGRESS NOTE  Dakota Patterson (DOB: 1961-04-22) WUJ:[id number] PCP: [facility]  Brief Narrative: Dakota Patterson is a 62 y.o. male with a history of stage IV lung CA, alcoholism in remission, HTN, 3L O2-dependent COPD, T2DM, on hospice who presented to the ED on 11/02/2022 with dysarthria and right sided weakness found to have multiple strokes, primarily affecting left MCA. He was also noted to have severe sepsis due to right sided pneumonia.   Subjective: Starting to move right leg, but still not right arm. R arm is swelling. Shortness of breath returning to baseline. Complains of right back pain which is chronic. Confirmed he usually takes MS contin and either morphine IR or oxycodone and hasn't gotten any here yet.  Objective: BP (!) 162/89   Pulse (!) 135   Temp 98.3 F (36.8 C) (Oral)   Resp (!) 24   Ht 5\' 3"  (1.6 m)   Wt 45.8 kg   SpO2 97%   BMI 17.89 kg/m   Gen: Chronically ill-appearing male Pulm: Diminished, nonlabored but tachypneic  CV: RRR, RUE edema GI: Soft, NT, ND, +BS  Neuro: Alert and oriented. Right hemiplegia noted. Ext: Warm, no deformities. Right arm flaccid and edematous diffusely, nontender. Skin: No new rashes, lesions or ulcers on visualized skin   Assessment & Plan: Principal Problem:   CVA (cerebral vascular accident) (HCC) Active Problems:   Acute kidney injury (HCC)   Stage 4 lung cancer (HCC)   Severe sepsis (HCC)   Alcoholism (HCC)   HTN (hypertension)   COPD (chronic obstructive pulmonary disease) (HCC)   Diabetes (HCC)   Protein-calorie malnutrition, severe  Acute multifocal CVA predominantly left MCA territory impacted: With continued right UE > LE hemiparesis. Suspect hypercoagulability of malignancy is etiology.  - Neurology consulted. No statin with LDL 64. A1c well controlled at 6%. No CES on echo. - Continue aspirin.  - Given patient on hospice care, PT/OT not planned.  - Dysphagia 3 diet  per SLP  - Raise R arm above level of heart. R/o DVT (hypercoagulable state) with venous U/S.   Severe sepsis likely secondary to right-sided pneumonia: R infrahilar opacity on CXR.  - Continue cefepime. MRSA PCR negative, avoiding vancomycin with AKI.   - Blood cultures NG2D. RSV, covid, flu PCR panel negative.    AKI: Due to sepsis. Improved significantly.  - Can restart home morphine with CrCl nearing 57ml/min.  - Continue I/O monitoring, avoidance of nephrotoxins.    Stage IV lung cancer, chronic cancer-related pain:  - Continue home hospice care at discharge. Palliative consulted here. DNR.   - PDMP reviewed. Reordered home MS contin 30mg  q12h and oxycodone 5mg  q4h prn as reported by spouse. Suspect restarting this will help with comfort and VS abnormalities.  - Reorder trazodone 50mg  qHS as he takes at home.   COPD: No wheezing currently - Continue controllers and prn albuterol   Hypertension:  - Restart metoprolol with sustained HR >130bpm and sustained HTN (nearing end of permissive HTN period.    NIDT2DM: HbA1c 6% - No interventions planned   Alcoholism: In remission   Severe protein calorie malnutrition:  - Unrestricted diet, supp protein (dys 3)  Hypokalemia:  - Supplement  Hypophosphatemia:  - Supplement and monitor  Hypomagnesemia:  - Supplement  Dakota Nine, MD [facility] [email or web address] 11/04/2022, 10:11 AM

## 2022-11-04 NOTE — ED Provider Notes (Signed)
[facility] Provider Note   CSN: [id number] Arrival date & time: 11/02/22  0946     History  Chief Complaint  Patient presents with   Code Stroke    Dakota Patterson is a 62 y.o. male.  Patient has history of COPD and lung cancer.  He awoke today with extreme weakness in his right arm and right leg.  Patient unable to walk  The history is provided by the patient and a relative. No language interpreter was used.  Weakness Severity:  Severe Onset quality:  Sudden Timing:  Constant Chronicity:  New Context: not alcohol use   Relieved by:  Nothing Worsened by:  Nothing Ineffective treatments:  None tried Associated symptoms: no abdominal pain, no chest pain, no cough, no diarrhea, no frequency, no headaches and no seizures        Home Medications Prior to Admission medications   Medication Sig Start Date End Date Taking? Authorizing Provider  acetaminophen (TYLENOL) 500 MG tablet Take 1,000 mg by mouth 2 (two) times daily.   Yes [provider]  albuterol (PROVENTIL) (2.5 MG/3ML) 0.083% nebulizer solution Take 2.5 mg by nebulization every 8 (eight) hours as needed for wheezing or shortness of breath.   Yes [provider]  EQ SENNA-S 8.6-50 MG tablet 1 tablet 2 (two) times daily. 10/01/22  Yes [provider]  folic acid (FOLVITE) 1 MG tablet Take 1 mg by mouth daily. 08/24/20  Yes [provider]  metoprolol tartrate (LOPRESSOR) 25 MG tablet Take 25 mg by mouth 2 (two) times daily.   Yes [provider]  morphine (MS CONTIN) 15 MG 12 hr tablet Take 15 mg by mouth every 12 (twelve) hours. 10/15/22  Yes [provider]  morphine (MS CONTIN) 30 MG 12 hr tablet Take 30 mg by mouth 2 (two) times daily. 10/29/22  Yes [provider]  morphine (MSIR) 15 MG tablet Take 15 mg by mouth every 4 (four) hours. 10/29/22  Yes [provider]  ondansetron (ZOFRAN-ODT) 4 MG disintegrating tablet Take 4 mg by  mouth every 4 (four) hours as needed for nausea or vomiting. 10/15/22  Yes [provider]  oxyCODONE (OXY IR/ROXICODONE) 5 MG immediate release tablet Take 5 mg by mouth every 4 (four) hours as needed for breakthrough pain.   Yes [provider]  SYMBICORT 160-4.5 MCG/ACT inhaler Inhale 2 puffs into the lungs 2 (two) times daily. 07/07/22  Yes [provider]  tamsulosin (FLOMAX) 0.4 MG CAPS capsule Take 0.4 mg by mouth daily. 10/22/22  Yes [provider]  Tiotropium Bromide Monohydrate 2.5 MCG/ACT AERS Inhale 2 each into the lungs daily. 03/20/22 03/20/23 Yes [provider]  traMADol (ULTRAM) 50 MG tablet Take 50 mg by mouth every 6 (six) hours as needed for moderate pain.   Yes [provider]  VENTOLIN HFA 108 (90 Base) MCG/ACT inhaler Inhale 1-2 puffs into the lungs every 4 (four) hours as needed for wheezing or shortness of breath. 10/23/22  Yes [provider]      Allergies    Lisinopril    Review of Systems   Review of Systems  Constitutional:  Negative for appetite change and fatigue.  HENT:  Negative for congestion, ear discharge and sinus pressure.   Eyes:  Negative for discharge.  Respiratory:  Negative for cough.   Cardiovascular:  Negative for chest pain.  Gastrointestinal:  Negative for abdominal pain and diarrhea.  Genitourinary:  Negative for frequency and hematuria.  Musculoskeletal:  Negative for back pain.  Skin:  Negative for rash.  Neurological:  Positive for weakness. Negative for seizures and headaches.  Psychiatric/Behavioral:  Negative for hallucinations.     Physical Exam Updated Vital Signs BP 134/71   Pulse (!) 105   Temp 98.3 F (36.8 C) (Oral)   Resp (!) 21   Ht 5\' 3"  (1.6 m)   Wt 45.8 kg   SpO2 96%   BMI 17.89 kg/m  Physical Exam Vitals and nursing note reviewed.  Constitutional:      Appearance: He is well-developed.  HENT:     Head: Normocephalic.  Eyes:     General: No scleral  icterus.    Conjunctiva/sclera: Conjunctivae normal.  Neck:     Thyroid: No thyromegaly.  Cardiovascular:     Rate and Rhythm: Normal rate and regular rhythm.     Heart sounds: No murmur heard.    No friction rub. No gallop.  Pulmonary:     Breath sounds: No stridor. No wheezing or rales.  Chest:     Chest wall: No tenderness.  Abdominal:     General: There is no distension.     Tenderness: There is no abdominal tenderness. There is no rebound.  Musculoskeletal:     Cervical back: Neck supple.     Comments: Patient unable to move right arm or right leg  Lymphadenopathy:     Cervical: No cervical adenopathy.  Skin:    Findings: No erythema or rash.  Neurological:     Mental Status: He is alert and oriented to person, place, and time.     Motor: No abnormal muscle tone.     Coordination: Coordination normal.  Psychiatric:        Behavior: Behavior normal.     ED Results / Procedures / Treatments   Labs (all labs ordered are listed, but only abnormal results are displayed) Labs Reviewed  CBC - Abnormal; Notable for the following components:      Result Value   WBC 26.1 (*)    RBC 4.00 (*)    MCV 107.8 (*)    MCH 34.3 (*)    All other components within normal limits  DIFFERENTIAL - Abnormal; Notable for the following components:   Neutro Abs 23.2 (*)    All other components within normal limits  COMPREHENSIVE METABOLIC PANEL - Abnormal; Notable for the following components:   Sodium 133 (*)    Chloride 96 (*)    CO2 21 (*)    Glucose, Bld 170 (*)    BUN 28 (*)    Creatinine, Ser 2.34 (*)    Calcium 8.5 (*)    Total Protein 6.2 (*)    Albumin 2.5 (*)    AST 52 (*)    GFR, Estimated 30 (*)    Anion gap 16 (*)    All other components within normal limits  RAPID URINE DRUG SCREEN, HOSP PERFORMED - Abnormal; Notable for the following components:   Opiates POSITIVE (*)    All other components within normal limits  URINALYSIS, ROUTINE W REFLEX MICROSCOPIC -  Abnormal; Notable for the following components:   Specific Gravity, Urine >1.046 (*)    Leukocytes,Ua SMALL (*)    All other components within normal limits  LACTIC ACID, PLASMA - Abnormal; Notable for the following components:   Lactic Acid, Venous 4.9 (*)    All other components within normal limits  LACTIC ACID, PLASMA - Abnormal; Notable for the following components:  Lactic Acid, Venous 3.8 (*)    All other components within normal limits  HEMOGLOBIN A1C - Abnormal; Notable for the following components:   Hgb A1c MFr Bld 6.0 (*)    All other components within normal limits  MAGNESIUM - Abnormal; Notable for the following components:   Magnesium 1.2 (*)    All other components within normal limits  BASIC METABOLIC PANEL - Abnormal; Notable for the following components:   Calcium 7.6 (*)    All other components within normal limits  CBC - Abnormal; Notable for the following components:   WBC 24.1 (*)    RBC 3.22 (*)    Hemoglobin 11.2 (*)    HCT 34.0 (*)    MCV 105.6 (*)    MCH 34.8 (*)    All other components within normal limits  GLUCOSE, CAPILLARY - Abnormal; Notable for the following components:   Glucose-Capillary 188 (*)    All other components within normal limits  CBC - Abnormal; Notable for the following components:   WBC 17.3 (*)    RBC 3.23 (*)    Hemoglobin 11.2 (*)    HCT 32.9 (*)    MCV 101.9 (*)    MCH 34.7 (*)    All other components within normal limits  MAGNESIUM - Abnormal; Notable for the following components:   Magnesium 1.6 (*)    All other components within normal limits  BASIC METABOLIC PANEL - Abnormal; Notable for the following components:   Potassium 3.1 (*)    Calcium 7.9 (*)    All other components within normal limits  PHOSPHORUS - Abnormal; Notable for the following components:   Phosphorus 1.9 (*)    All other components within normal limits  RESP PANEL BY RT-PCR (RSV, FLU A&B, COVID)  RVPGX2  CULTURE, BLOOD (ROUTINE X 2)  CULTURE,  BLOOD (ROUTINE X 2)  MRSA NEXT GEN BY PCR, NASAL  ETHANOL  PROTIME-INR  APTT  LIPID PANEL  LACTIC ACID, PLASMA    EKG EKG Interpretation  Date/Time:  Monday Nov 02 2022 10:05:49 EDT Ventricular Rate:  142 PR Interval:  118 QRS Duration: 108 QT Interval:  298 QTC Calculation: 457 R Axis:   81 Text Interpretation: Sinus tachycardia Ventricular premature complex Borderline right axis deviation Abnormal T, consider ischemia, lateral leads Artifact in lead(s) I II III aVL aVF V1 V2 V3 V5 V6 No significant change since last tracing Confirmed by Jacalyn Lefevre [phone]) on 11/03/2022 2:34:38 PM  Radiology ECHOCARDIOGRAM COMPLETE  Result Date: 11/03/2022    ECHOCARDIOGRAM REPORT   Patient Name:   Dakota Patterson Date of Exam: 11/03/2022 Medical Rec #:  [id number]       Height:       63.0 in Accession #:    [id number]      Weight:       96.3 lb Date of Birth:  01/02/1961       BSA:          1.418 m Patient Age:    62 years        BP:           121/53 mmHg Patient Gender: M               HR:           127 bpm. [facility] Procedure: 2D Echo, Color Doppler and Cardiac Doppler Indications:    Stroke I63.9  History:        Patient has no prior  history of Echocardiogram examinations.                 Stage 4 lung cancer, Signs/Symptoms:Shortness of Breath and                 Altered Mental Status; Risk Factors:Hypertension, Diabetes,                 Current Smoker and Dyslipidemia. Sepsis.  Sonographer:    Aron Baba Referring Phys: [id number] PRATIK D [facility]  Sonographer Comments: No parasternal window, no apical window and Technically difficult study due to poor echo windows. Image acquisition challenging due to uncooperative patient, Image acquisition challenging due to COPD and Image acquisition challenging due to patient body habitus. IMPRESSIONS  1. Left ventricular ejection fraction, by estimation, is 55 to 60%. The left ventricle has normal function. Left ventricular endocardial border not  optimally defined to evaluate regional wall motion. Left ventricular diastolic parameters are indeterminate.  2. Right ventricular systolic function is normal. The right ventricular size is normal.  3. The mitral valve was not well visualized. No evidence of mitral valve regurgitation. No evidence of mitral stenosis.  4. The aortic valve was not well visualized. There is mild calcification of the aortic valve. There is mild thickening of the aortic valve. Aortic valve regurgitation is not visualized. No aortic stenosis is present.  5. The inferior vena cava is normal in size with greater than 50% respiratory variability, suggesting right atrial pressure of 3 mmHg.  6. Technically difficult study FINDINGS  Left Ventricle: Left ventricular ejection fraction, by estimation, is 55 to 60%. The left ventricle has normal function. Left ventricular endocardial border not optimally defined to evaluate regional wall motion. The left ventricular internal cavity size was normal in size. There is no left ventricular hypertrophy. Left ventricular diastolic parameters are indeterminate. Right Ventricle: The right ventricular size is normal. Right vetricular wall thickness was not well visualized. Right ventricular systolic function is normal. Left Atrium: Left atrial size was normal in size. Right Atrium: Right atrial size was normal in size. Pericardium: There is no evidence of pericardial effusion. Mitral Valve: The mitral valve was not well visualized. No evidence of mitral valve regurgitation. No evidence of mitral valve stenosis. Tricuspid Valve: The tricuspid valve is not well visualized. Tricuspid valve regurgitation is not demonstrated. No evidence of tricuspid stenosis. Aortic Valve: The aortic valve was not well visualized. There is mild calcification of the aortic valve. There is mild thickening of the aortic valve. There is mild aortic valve annular calcification. Aortic valve regurgitation is not visualized. No aortic  stenosis is present. Aortic valve mean gradient measures 2.6 mmHg. Aortic valve peak gradient measures 5.6 mmHg. Aortic valve area, by VTI measures 2.16 cm. Pulmonic Valve: The pulmonic valve was not well visualized. Pulmonic valve regurgitation is not visualized. No evidence of pulmonic stenosis. Aorta: The aortic root was not well visualized. Venous: The inferior vena cava is normal in size with greater than 50% respiratory variability, suggesting right atrial pressure of 3 mmHg. IAS/Shunts: The interatrial septum was not well visualized.  LEFT VENTRICLE PLAX 2D LVIDd:         2.90 cm   Diastology LVIDs:         2.30 cm   LV e' medial:    12.60 cm/s LV PW:         0.80 cm   LV E/e' medial:  7.1 LV IVS:        0.90 cm   LV  e' lateral:   12.30 cm/s LVOT diam:     1.60 cm   LV E/e' lateral: 7.3 LV SV:         32 LV SV Index:   23 LVOT Area:     2.01 cm  RIGHT VENTRICLE RV S prime:     20.40 cm/s LEFT ATRIUM           Index LA diam:      2.80 cm 1.98 cm/m LA Vol (A4C): 22.4 ml 15.80 ml/m  AORTIC VALVE AV Area (Vmax):    1.50 cm AV Area (Vmean):   1.73 cm AV Area (VTI):     2.16 cm AV Vmax:           118.09 cm/s AV Vmean:          73.320 cm/s AV VTI:            0.149 m AV Peak Grad:      5.6 mmHg AV Mean Grad:      2.6 mmHg LVOT Vmax:         87.90 cm/s LVOT Vmean:        63.100 cm/s LVOT VTI:          0.160 m LVOT/AV VTI ratio: 1.07  AORTA Ao Root diam: 2.60 cm MITRAL VALVE MV Area (PHT): 7.37 cm    SHUNTS MV Decel Time: 103 msec    Systemic VTI:  0.16 m MV E velocity: 89.20 cm/s  Systemic Diam: 1.60 cm MV A velocity: 82.00 cm/s MV E/A ratio:  1.09 Dina Rich MD Electronically signed by Dina Rich MD Signature Date/Time: 11/03/2022/12:19:57 PM    Final     Procedures Procedures    Medications Ordered in ED Medications  Chlorhexidine Gluconate Cloth 2 % PADS 6 each (6 each Topical Given 11/04/22 0934)  folic acid (FOLVITE) tablet 1 mg (1 mg Oral Given 11/04/22 0934)  albuterol (PROVENTIL) (2.5  MG/3ML) 0.083% nebulizer solution 2.5 mg (2.5 mg Nebulization Given 11/04/22 0841)  acetaminophen (TYLENOL) tablet 1,000 mg (has no administration in time range)  tamsulosin (FLOMAX) capsule 0.4 mg (0.4 mg Oral Given 11/04/22 0934)  ondansetron (ZOFRAN-ODT) disintegrating tablet 4 mg (has no administration in time range)  acetaminophen (TYLENOL) tablet 650 mg (has no administration in time range)    Or  acetaminophen (TYLENOL) suppository 650 mg (has no administration in time range)  ondansetron (ZOFRAN) tablet 4 mg (has no administration in time range)    Or  ondansetron (ZOFRAN) injection 4 mg (has no administration in time range)  0.9 %  sodium chloride infusion (0 mLs Intravenous Stopped 11/03/22 0230)  aspirin EC tablet 81 mg (81 mg Oral Given 11/04/22 0934)  HYDROmorphone (DILAUDID) injection 0.5 mg (0.5 mg Intravenous Given 11/04/22 0503)  umeclidinium bromide (INCRUSE ELLIPTA) 62.5 MCG/ACT 1 puff (1 puff Inhalation Given 11/04/22 0840)  ceFEPIme (MAXIPIME) 2 g in sodium chloride 0.9 % 100 mL IVPB (0 g Intravenous Stopped 11/04/22 1011)  feeding supplement (ENSURE ENLIVE / ENSURE PLUS) liquid 237 mL (237 mLs Oral Not Given 11/04/22 1535)  multivitamin with minerals tablet 1 tablet (1 tablet Oral Given 11/04/22 0933)  thiamine (VITAMIN B1) tablet 100 mg (100 mg Oral Given 11/04/22 0934)  Oral care mouth rinse (15 mLs Mouth Rinse Not Given 11/04/22 1121)  Oral care mouth rinse (has no administration in time range)  metoprolol tartrate (LOPRESSOR) tablet 25 mg (25 mg Oral Given 11/04/22 0933)  morphine (MS CONTIN) 12 hr tablet 30 mg (30 mg  Oral Given 11/04/22 1109)  oxyCODONE (Oxy IR/ROXICODONE) immediate release tablet 5 mg (has no administration in time range)  traZODone (DESYREL) tablet 50 mg (has no administration in time range)  phosphorus (K PHOS NEUTRAL) tablet 500 mg (500 mg Oral Given 11/04/22 1110)  iohexol (OMNIPAQUE) 350 MG/ML injection 75 mL (75 mLs Intravenous Contrast Given 11/02/22 1018)  sodium  chloride 0.9 % bolus 2,000 mL (2,000 mLs Intravenous Bolus 11/02/22 1212)  ceFEPIme (MAXIPIME) 2 g in sodium chloride 0.9 % 100 mL IVPB (0 g Intravenous Stopped 11/02/22 1256)  metroNIDAZOLE (FLAGYL) IVPB 500 mg (0 mg Intravenous Stopped 11/02/22 1325)  vancomycin (VANCOCIN) IVPB 1000 mg/200 mL premix (0 mg Intravenous Stopped 11/02/22 1412)  traZODone (DESYREL) tablet 25 mg (25 mg Oral Given 11/02/22 2226)  magnesium sulfate IVPB 2 g 50 mL (0 g Intravenous Stopped 11/03/22 0910)  melatonin tablet 6 mg (6 mg Oral Given 11/03/22 2337)  phosphorus (K PHOS NEUTRAL) tablet 500 mg (500 mg Oral Given 11/04/22 0630)  magnesium sulfate IVPB 2 g 50 mL (0 g Intravenous Stopped 11/04/22 0747)  potassium chloride SA (KLOR-CON M) CR tablet 40 mEq (40 mEq Oral Given 11/04/22 0933)    ED Course/ Medical Decision Making/ A&P   {  CRITICAL CARE Performed by: Bethann Berkshire Total critical care time: 45 minutes Critical care time was exclusive of separately billable procedures and treating other patients. Critical care was necessary to treat or prevent imminent or life-threatening deterioration. Critical care was time spent personally by me on the following activities: development of treatment plan with patient and/or surrogate as well as nursing, discussions with consultants, evaluation of patient's response to treatment, examination of patient, obtaining history from patient or surrogate, ordering and performing treatments and interventions, ordering and review of laboratory studies, ordering and review of radiographic studies, pulse oximetry and re-evaluation of patient's condition.  Code stroke was called.  Patient was out of the window for thrombolytics.  He was seen by neurologist who felt like since the patient has no LVO and is out of the window for thrombolytics that he could be admitted to the medicine service at [facility] Decision Making Amount and/or Complexity of Data Reviewed Labs: ordered. Radiology:  ordered.  Risk Prescription drug management. Decision regarding hospitalization.   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

## 2022-11-05 DIAGNOSIS — I639 Cerebral infarction, unspecified: Secondary | ICD-10-CM | POA: Diagnosis not present

## 2022-11-05 LAB — BASIC METABOLIC PANEL
Anion gap: 9 (ref 5–15)
BUN: 11 mg/dL (ref 8–23)
CO2: 25 mmol/L (ref 22–32)
Calcium: 7.6 mg/dL — ABNORMAL LOW (ref 8.9–10.3)
Chloride: 104 mmol/L (ref 98–111)
Creatinine, Ser: 0.71 mg/dL (ref 0.61–1.24)
GFR, Estimated: >60 mL/min (ref >60)
Glucose, Bld: 85 mg/dL (ref 70–99)
Potassium: 2.6 mmol/L — CL (ref 3.5–5.1)
Sodium: 138 mmol/L (ref 135–145)

## 2022-11-05 LAB — MAGNESIUM: Magnesium: 1.6 mg/dL — ABNORMAL LOW (ref 1.7–2.4)

## 2022-11-05 LAB — PHOSPHORUS: Phosphorus: 3.9 mg/dL (ref 2.5–4.6)

## 2022-11-05 MED ORDER — ASPIRIN 81 MG PO TBEC: 81.0000 mg | DELAYED_RELEASE_TABLET | Freq: Every day | ORAL | 0 refills | Status: AC

## 2022-11-05 MED ORDER — POTASSIUM CHLORIDE 20 MEQ PO PACK
40.0000 meq | PACK | Freq: Once | ORAL | Status: AC
  Administered 2022-11-05: 40 meq via ORAL
  Filled 2022-11-05: qty 2

## 2022-11-05 MED ORDER — CEFDINIR 300 MG PO CAPS: 300.0000 mg | ORAL_CAPSULE | Freq: Two times a day (BID) | ORAL | 0 refills | Status: AC

## 2022-11-05 MED ORDER — SODIUM CHLORIDE 0.9 % IV SOLN
2.0000 g | Freq: Three times a day (TID) | INTRAVENOUS | Status: DC
  Administered 2022-11-05: 2 g via INTRAVENOUS
  Filled 2022-11-05: qty 12.5

## 2022-11-05 MED ORDER — MAGNESIUM SULFATE 2 GM/50ML IV SOLN
2.0000 g | Freq: Once | INTRAVENOUS | Status: AC
  Administered 2022-11-05: 2 g via INTRAVENOUS
  Filled 2022-11-05: qty 50

## 2022-11-05 NOTE — Progress Notes (Signed)
Palliative: Dakota Patterson is resting quietly in bed.  He appears acutely/chronically ill and frail.  He is alert, able to make his needs known.  His wife, Tresa Endo, is present at bedside.  We talked about the plan for discharging home today with the benefits of [facility] hospice services already in place.  Mrs. Plaza shares that she needs to reach out to [facility] for equipment delivery.  Face-to-face conference with bedside nursing staff and transition of care team related to patient condition, needs, goals of care, disposition.  Plan: Home with the benefits of [facility] hospice already in place.   25 minutes Lillia Carmel, NP Palliative medicine team Team phone [phone] Greater than 50% of this time was spent counseling and coordinating care related to the above assessment and plan.

## 2022-11-05 NOTE — Progress Notes (Signed)
Pharmacy Antibiotic Note  Dakota Patterson is a 62 y.o. male admitted on 11/02/2022 with  unknown source of infection .  Pharmacy has been consulted for cefepime dosing.  Afebrile, wbc 17. Scr continues to improve. Will adjust cefepime dosing.   Plan: Increase Cefepime 2000 mg IV every 8 hours. Monitor labs, c/s, and vanco levels as  indicated.  Height: 5\' 3"  (160 cm) Weight: 45.8 kg (100 lb 15.5 oz) IBW/kg (Calculated) : 56.9  Temp (24hrs), Avg:98.5 F (36.9 C), Min:98.2 F (36.8 C), Max:99 F (37.2 C)  Recent Labs  Lab 11/02/22 1117 11/02/22 1216 11/02/22 1410 11/03/22 0523 11/04/22 0423 11/05/22 0447  WBC 26.1*  --   --  24.1* 17.3*  --   CREATININE 2.34*  --   --  1.17 0.82 0.71  LATICACIDVEN  --  4.9* 3.8* 1.4  --   --      Estimated Creatinine Clearance: 61.2 mL/min (by C-G formula based on SCr of 0.71 mg/dL).    Allergies  Allergen Reactions   Lisinopril Other (See Comments) and Rash    rash    Antimicrobials this admission: Vanco 5/6 >> Cefepime 5/6 >> Flagyl 5/6  Microbiology results: 5/6 BCx: NGTD MRSA neg  Thank you for allowing pharmacy to be a part of this patient's care.  Sheppard Coil PharmD., BCPS Clinical Pharmacist 11/05/2022 8:47 AM

## 2022-11-05 NOTE — Discharge Summary (Signed)
Physician Discharge Summary   Patient: Dakota Patterson MRN: [id number] DOB: 11/22/1960  Admit date:     11/02/2022  Discharge date: 11/05/22  Discharge Physician: Tyrone Nine   PCP: [facility]   Recommendations at discharge:  Continue home hospice management. Discharged 5/9 after admission for pneumonia (complete cefdinir after DC) and stroke.  Discharge Diagnoses: Principal Problem:   CVA (cerebral vascular accident) (HCC) Active Problems:   Acute kidney injury (HCC)   Stage 4 lung cancer (HCC)   Severe sepsis (HCC)   Alcoholism (HCC)   HTN (hypertension)   COPD (chronic obstructive pulmonary disease) (HCC)   Diabetes (HCC)   Protein-calorie malnutrition, severe  Hospital Course: Dakota Patterson is a 62 y.o. male with a history of stage IV lung CA, alcoholism in remission, HTN, 3L O2-dependent COPD, T2DM, on hospice who presented to the ED on 11/02/2022 with dysarthria and right sided weakness found to have multiple strokes, primarily affecting left MCA. He was also noted to have severe sepsis due to right sided pneumonia. With antibiotics, respiratory status has stabilized and sepsis physiology resolved. He will return home under hospice care with DME and to complete a course of antibiotics. Please see details below.  Assessment and Plan: Acute multifocal CVA predominantly left MCA territory impacted: With continued right UE > LE hemiparesis. Suspect hypercoagulability of malignancy is etiology.  - Neurology consulted. No statin with LDL 64. A1c well controlled at 6%. No CES on echo. - Continue aspirin.  - Given patient on hospice care, PT/OT not planned.  - Dysphagia 3 diet per SLP, unrestricted content in hospice patient. - Raise R arm above level of heart. No DVT on UE venous U/S.   Severe sepsis likely secondary to right-sided pneumonia: R infrahilar opacity on CXR. RSV, covid, flu PCR panel negative. MRSA PCR negative, avoiding vancomycin with AKI.    - Continue antibiotics with omnicef based on improvement with cephalosporin. - Blood cultures NG3D.    AKI: Due to sepsis. Improved significantly.  - Can restart home morphine with CrCl nearing 71ml/min.    Stage IV lung cancer, chronic cancer-related pain:  - Continue home hospice care at discharge. Palliative consulted here. DNR.   - PDMP reviewed. Reordered home MS contin 30mg  q12h and oxycodone 5mg  q4h prn as reported by spouse.   - Continue trazodone 50mg  qHS as he takes at home. No new prescriptions to these effects were given at discharge.   COPD: No wheezing currently - Continue controllers and prn albuterol   Hypertension:  - Restart home Tx now that outside window for permissive HTN   NIDT2DM: HbA1c 6% - No interventions planned   Alcoholism: In remission    Severe protein calorie malnutrition:  - Unrestricted diet, supp protein (dys 3)   Hypokalemia:  - Supplemented, pt's po intake expected by spouse to improve significantly once he gets home, declines supplement at this time.    Hypophosphatemia:  - Supplemented     Hypomagnesemia:  - Supplemented  Consultants: Palliative care, neurology Procedures performed: None  Disposition:  Home hospice Diet recommendation:  Regular diet DISCHARGE MEDICATION: Allergies as of 11/05/2022       Reactions   Lisinopril Other (See Comments), Rash   rash        Medication List     TAKE these medications    acetaminophen 500 MG tablet Commonly known as: TYLENOL Take 1,000 mg by mouth 2 (two) times daily.   albuterol (2.5 MG/3ML) 0.083% nebulizer solution  Commonly known as: PROVENTIL Take 2.5 mg by nebulization every 8 (eight) hours as needed for wheezing or shortness of breath.   Ventolin HFA 108 (90 Base) MCG/ACT inhaler Generic drug: albuterol Inhale 1-2 puffs into the lungs every 4 (four) hours as needed for wheezing or shortness of breath.   aspirin EC 81 MG tablet Take 1 tablet (81 mg total) by mouth  daily. Swallow whole. Start taking on: Nov 06, 2022   cefdinir 300 MG capsule Commonly known as: OMNICEF Take 1 capsule (300 mg total) by mouth 2 (two) times daily for 4 days.   EQ Senna-S 8.6-50 MG tablet Generic drug: senna-docusate 1 tablet 2 (two) times daily.   folic acid 1 MG tablet Commonly known as: FOLVITE Take 1 mg by mouth daily.   metoprolol tartrate 25 MG tablet Commonly known as: LOPRESSOR Take 25 mg by mouth 2 (two) times daily.   morphine 15 MG 12 hr tablet Commonly known as: MS CONTIN Take 15 mg by mouth every 12 (twelve) hours.   morphine 15 MG tablet Commonly known as: MSIR Take 15 mg by mouth every 4 (four) hours.   morphine 30 MG 12 hr tablet Commonly known as: MS CONTIN Take 30 mg by mouth 2 (two) times daily.   ondansetron 4 MG disintegrating tablet Commonly known as: ZOFRAN-ODT Take 4 mg by mouth every 4 (four) hours as needed for nausea or vomiting.   oxyCODONE 5 MG immediate release tablet Commonly known as: Oxy IR/ROXICODONE Take 5 mg by mouth every 4 (four) hours as needed for breakthrough pain.   Symbicort 160-4.5 MCG/ACT inhaler Generic drug: budesonide-formoterol Inhale 2 puffs into the lungs 2 (two) times daily.   tamsulosin 0.4 MG Caps capsule Commonly known as: FLOMAX Take 0.4 mg by mouth daily.   Tiotropium Bromide Monohydrate 2.5 MCG/ACT Aers Inhale 2 each into the lungs daily.   traMADol 50 MG tablet Commonly known as: ULTRAM Take 50 mg by mouth every 6 (six) hours as needed for moderate pain.        Follow-up Information     [facility] Follow up.   Contact information: [address] [place] [address] [phone]                Discharge Exam: Filed Weights   11/02/22 0946 11/02/22 1455 11/04/22 0437  Weight: 49.4 kg 43.7 kg 45.8 kg  Chronically ill appearing male in no distress, calm and comfortable Diminished without crackles or wheezes Right UE > LE  hemiparesis  Condition at discharge: stable  The results of significant diagnostics from this hospitalization (including imaging, microbiology, ancillary and laboratory) are listed below for reference.   Imaging Studies: US Venous Img Upper Uni Right(DVT)  Result Date: 11/04/2022 CLINICAL DATA:  Left upper extremity edema. EXAM: LEFT UPPER EXTREMITY VENOUS DOPPLER ULTRASOUND TECHNIQUE: Gray-scale sonography with graded compression, as well as color Doppler and duplex ultrasound were performed to evaluate the upper extremity deep venous system from the level of the subclavian vein and including the jugular, axillary, basilic, radial, ulnar and upper cephalic vein. Spectral Doppler was utilized to evaluate flow at rest and with distal augmentation maneuvers. COMPARISON:  None Available. FINDINGS: Contralateral Subclavian Vein: Respiratory phasicity is normal and symmetric with the symptomatic side. No evidence of thrombus. Normal compressibility. Internal Jugular Vein: No evidence of thrombus. Normal compressibility, respiratory phasicity and response to augmentation. Subclavian Vein: No evidence of thrombus. Normal compressibility, respiratory phasicity and response to augmentation. Axillary Vein: No  evidence of thrombus. Normal compressibility, respiratory phasicity and response to augmentation. Cephalic Vein: No evidence of thrombus. Normal compressibility, respiratory phasicity and response to augmentation. Basilic Vein: No evidence of thrombus. Normal compressibility, respiratory phasicity and response to augmentation. Brachial Veins: No evidence of thrombus. Normal compressibility, respiratory phasicity and response to augmentation. Radial Veins: No evidence of thrombus. Normal compressibility, respiratory phasicity and response to augmentation. Ulnar Veins: No evidence of thrombus. Normal compressibility, respiratory phasicity and response to augmentation. Venous Reflux:  None visualized. Other  Findings: No evidence of superficial thrombophlebitis or abnormal fluid collection. IMPRESSION: No evidence of DVT within the left upper extremity. Electronically Signed   By: Irish Lack M.D.   On: 11/04/2022 17:04   ECHOCARDIOGRAM COMPLETE  Result Date: 11/03/2022    ECHOCARDIOGRAM REPORT   Patient Name:   ZAHEEM REEG Date of Exam: 11/03/2022 Medical Rec #:  [id number]       Height:       63.0 in Accession #:    [id number]      Weight:       96.3 lb Date of Birth:  May 17, 1961       BSA:          1.418 m Patient Age:    62 years        BP:           121/53 mmHg Patient Gender: M               HR:           127 bpm. [facility] Procedure: 2D Echo, Color Doppler and Cardiac Doppler Indications:    Stroke I63.9  History:        Patient has no prior history of Echocardiogram examinations.                 Stage 4 lung cancer, Signs/Symptoms:Shortness of Breath and                 Altered Mental Status; Risk Factors:Hypertension, Diabetes,                 Current Smoker and Dyslipidemia. Sepsis.  Sonographer:    Aron Baba Referring Phys: [id number] PRATIK D [facility]  Sonographer Comments: No parasternal window, no apical window and Technically difficult study due to poor echo windows. Image acquisition challenging due to uncooperative patient, Image acquisition challenging due to COPD and Image acquisition challenging due to patient body habitus. IMPRESSIONS  1. Left ventricular ejection fraction, by estimation, is 55 to 60%. The left ventricle has normal function. Left ventricular endocardial border not optimally defined to evaluate regional wall motion. Left ventricular diastolic parameters are indeterminate.  2. Right ventricular systolic function is normal. The right ventricular size is normal.  3. The mitral valve was not well visualized. No evidence of mitral valve regurgitation. No evidence of mitral stenosis.  4. The aortic valve was not well visualized. There is mild calcification of the  aortic valve. There is mild thickening of the aortic valve. Aortic valve regurgitation is not visualized. No aortic stenosis is present.  5. The inferior vena cava is normal in size with greater than 50% respiratory variability, suggesting right atrial pressure of 3 mmHg.  6. Technically difficult study FINDINGS  Left Ventricle: Left ventricular ejection fraction, by estimation, is 55 to 60%. The left ventricle has normal function. Left ventricular endocardial border not optimally defined to evaluate regional wall motion. The left ventricular internal cavity size was normal in  size. There is no left ventricular hypertrophy. Left ventricular diastolic parameters are indeterminate. Right Ventricle: The right ventricular size is normal. Right vetricular wall thickness was not well visualized. Right ventricular systolic function is normal. Left Atrium: Left atrial size was normal in size. Right Atrium: Right atrial size was normal in size. Pericardium: There is no evidence of pericardial effusion. Mitral Valve: The mitral valve was not well visualized. No evidence of mitral valve regurgitation. No evidence of mitral valve stenosis. Tricuspid Valve: The tricuspid valve is not well visualized. Tricuspid valve regurgitation is not demonstrated. No evidence of tricuspid stenosis. Aortic Valve: The aortic valve was not well visualized. There is mild calcification of the aortic valve. There is mild thickening of the aortic valve. There is mild aortic valve annular calcification. Aortic valve regurgitation is not visualized. No aortic stenosis is present. Aortic valve mean gradient measures 2.6 mmHg. Aortic valve peak gradient measures 5.6 mmHg. Aortic valve area, by VTI measures 2.16 cm. Pulmonic Valve: The pulmonic valve was not well visualized. Pulmonic valve regurgitation is not visualized. No evidence of pulmonic stenosis. Aorta: The aortic root was not well visualized. Venous: The inferior vena cava is normal in size  with greater than 50% respiratory variability, suggesting right atrial pressure of 3 mmHg. IAS/Shunts: The interatrial septum was not well visualized.  LEFT VENTRICLE PLAX 2D LVIDd:         2.90 cm   Diastology LVIDs:         2.30 cm   LV e' medial:    12.60 cm/s LV PW:         0.80 cm   LV E/e' medial:  7.1 LV IVS:        0.90 cm   LV e' lateral:   12.30 cm/s LVOT diam:     1.60 cm   LV E/e' lateral: 7.3 LV SV:         32 LV SV Index:   23 LVOT Area:     2.01 cm  RIGHT VENTRICLE RV S prime:     20.40 cm/s LEFT ATRIUM           Index LA diam:      2.80 cm 1.98 cm/m LA Vol (A4C): 22.4 ml 15.80 ml/m  AORTIC VALVE AV Area (Vmax):    1.50 cm AV Area (Vmean):   1.73 cm AV Area (VTI):     2.16 cm AV Vmax:           118.09 cm/s AV Vmean:          73.320 cm/s AV VTI:            0.149 m AV Peak Grad:      5.6 mmHg AV Mean Grad:      2.6 mmHg LVOT Vmax:         87.90 cm/s LVOT Vmean:        63.100 cm/s LVOT VTI:          0.160 m LVOT/AV VTI ratio: 1.07  AORTA Ao Root diam: 2.60 cm MITRAL VALVE MV Area (PHT): 7.37 cm    SHUNTS MV Decel Time: 103 msec    Systemic VTI:  0.16 m MV E velocity: 89.20 cm/s  Systemic Diam: 1.60 cm MV A velocity: 82.00 cm/s MV E/A ratio:  1.09 Dina Rich MD Electronically signed by Dina Rich MD Signature Date/Time: 11/03/2022/12:19:57 PM    Final    MR BRAIN WO CONTRAST  Result Date: 11/02/2022 CLINICAL DATA:  Altered mental  status EXAM: MRI HEAD WITHOUT CONTRAST TECHNIQUE: Multiplanar, multiecho pulse sequences of the brain and surrounding structures were obtained without intravenous contrast. COMPARISON:  Same-day CT head FINDINGS: Brain: There are scattered small areas of diffusion restriction in both cerebral hemispheres in the bilateral MCA and left PCA distributions, most notably in the left MCA distribution in the precentral gyrus. There is associated FLAIR signal abnormality but no hemorrhage or mass effect. Findings are consistent with small acute infarcts, possibly embolic  in etiology. There is no acute intracranial hemorrhage or extra-axial fluid collection. Background parenchymal volume is within expected limits for age. The ventricles are normal in size. FLAIR signal abnormality in the periventricular white matter likely reflects underlying chronic small-vessel ischemic change, mild for age. The pituitary and suprasellar region are grossly unremarkable. There is no mass lesion. There is no mass effect or midline shift. Vascular: Normal flow voids. Skull and upper cervical spine: Normal marrow signal. Sinuses/Orbits: The paranasal sinuses are clear. The globes and orbits are unremarkable. Other: None. IMPRESSION: Scattered acute infarcts in the bilateral MCA and left PCA distributions without hemorrhage or mass effect, most notably in the left MCA distribution. Findings may be embolic in etiology. Electronically Signed   By: Lesia Hausen M.D.   On: 11/02/2022 14:57   DG Chest Port 1 View  Result Date: 11/02/2022 CLINICAL DATA:  Sepsis. EXAM: PORTABLE CHEST 1 VIEW COMPARISON:  August 11, 2022. FINDINGS: The heart size and mediastinal contours are within normal limits. Old left rib fractures are noted. Right infrahilar opacity is now noted concerning for pneumonia. Mild interstitial densities are noted throughout both lungs which may represent scarring. IMPRESSION: Right infrahilar opacity is noted concerning for pneumonia. Followup PA and lateral chest X-ray is recommended in 3-4 weeks following trial of antibiotic therapy to ensure resolution and exclude underlying malignancy. Aortic Atherosclerosis (ICD10-I70.0). Electronically Signed   By: Lupita Raider M.D.   On: 11/02/2022 12:34   CT ANGIO HEAD NECK W WO CM W PERF (CODE STROKE)  Result Date: 11/02/2022 CLINICAL DATA:  Transient ischemic attack (TIA) EXAM: CT ANGIOGRAPHY HEAD AND NECK CT PERFUSION BRAIN TECHNIQUE: Multidetector CT imaging of the head and neck was performed using the standard protocol during bolus  administration of intravenous contrast. Multiplanar CT image reconstructions and MIPs were obtained to evaluate the vascular anatomy. Carotid stenosis measurements (when applicable) are obtained utilizing NASCET criteria, using the distal internal carotid diameter as the denominator. Multiphase CT imaging of the brain was performed following IV bolus contrast injection. Subsequent parametric perfusion maps were calculated using RAPID software. RADIATION DOSE REDUCTION: This exam was performed according to the departmental dose-optimization program which includes automated exposure control, adjustment of the mA and/or kV according to patient size and/or use of iterative reconstruction technique. CONTRAST:  75mL OMNIPAQUE IOHEXOL 350 MG/ML SOLN COMPARISON:  Same day CT head FINDINGS: CT HEAD FINDINGS See same day CT brain for intracranial findings. CTA NECK FINDINGS Aortic arch: Standard branching. There is severe atherosclerotic plaque of the aortic arch with at least moderate to severe stenosis of the origins of the left common carotid left subclavian artery. Right carotid system: No evidence of dissection, stenosis (50% or greater) or occlusion. Left carotid system: There is a large amount of mixed calcified and noncalcified atherosclerotic plaque of the carotid bifurcation on the left. There is mild (less than 50%) narrowing of the origin left ICA. Vertebral arteries: Right dominant. There is moderate narrowing of the origin of the left vertebral artery. Skeleton: Negative Other  neck: Negative. Upper chest: Compared to recent prior CT of the chest dated 08/11/2022, there are new consolidative airspace opacities in the right upper lobe, worrisome for infection. There is background of severe centrilobular emphysema. Review of the MIP images confirms the above findings CTA HEAD FINDINGS Anterior circulation: No significant stenosis, proximal occlusion, aneurysm, or vascular malformation. Atherosclerotic plaque of the  carotid siphons bilaterally. Posterior circulation: The basilar artery is diffusely small in caliber. There are fetal PCAs bilaterally. Venous sinuses: As permitted by contrast timing, patent. Anatomic variants: Fetal PCAs bilaterally. Review of the MIP images confirms the above findings CT Brain Perfusion Findings: ASPECTS: 10 CBF (<30%) Volume: 0mL Perfusion (Tmax>6.0s) volume: 5mL Mismatch Volume: 5mL Infarction Location:No infarct. Perfusion abnormality is localized to the region of the left post-central gyrus. IMPRESSION: 1. No emergent large vessel occlusion. 2. No infarct by CT perfusion. 5 ml perfusion abnormality is localized to the region of the left post-central gyrus. 3. Severe atherosclerotic plaque of the aortic arch with moderate to severe stenosis of the origins of the left common carotid and left subclavian arteries. 4. Large amount of mixed calcified and noncalcified atherosclerotic plaque of the left carotid bifurcation with mild (less than 50%) narrowing of the origin of the left ICA. 5. Compared to recent prior CT of the chest dated 08/11/2022, there are new consolidative airspace opacities in the right upper lobe, worrisome for infection. Recommend further evaluation with a dedicated CT of the chest Findings were paged to Dr. Pearlean Brownie on 11/02/22 at 10:58 AM. Aortic Atherosclerosis (ICD10-I70.0) and Emphysema (ICD10-J43.9). Electronically Signed   By: Lorenza Cambridge M.D.   On: 11/02/2022 10:58   CT HEAD CODE STROKE WO CONTRAST  Result Date: 11/02/2022 CLINICAL DATA:  Code stroke. Stroke suspected. Right-sided weakness EXAM: CT HEAD WITHOUT CONTRAST TECHNIQUE: Contiguous axial images were obtained from the base of the skull through the vertex without intravenous contrast. RADIATION DOSE REDUCTION: This exam was performed according to the departmental dose-optimization program which includes automated exposure control, adjustment of the mA and/or kV according to patient size and/or use of iterative  reconstruction technique. COMPARISON:  None Available. FINDINGS: Brain: No evidence of acute infarction, hemorrhage, hydrocephalus, extra-axial collection or mass lesion/mass effect. Vascular: No hyperdense vessel or unexpected calcification. Skull: Normal. Negative for fracture or focal lesion. Sinuses/Orbits: No middle ear or mastoid effusion. paranasal sinuses are clear. Orbits are unremarkable. Other: None. ASPECTS [facility] Stroke Program Early CT Score): 10 IMPRESSION: No hemorrhage or CT evidence of an acute infarct.  Aspects is 10. Findings were discussed with Dr. Estell Harpin on 11/02/22 at 10:02 AM. Electronically Signed   By: Lorenza Cambridge M.D.   On: 11/02/2022 10:03    Microbiology: Results for orders placed or performed during the hospital encounter of 11/02/22  Blood Culture (routine x 2)     Status: None (Preliminary result)   Collection Time: 11/02/22 12:00 PM   Specimen: BLOOD  Result Value Ref Range Status   Specimen Description BLOOD BLOOD LEFT ARM  Final   Special Requests   Final    BOTTLES DRAWN AEROBIC AND ANAEROBIC Blood Culture adequate volume   Culture   Final    NO GROWTH 3 DAYS Performed at [facility], [address], [place], [place] [address]    Report Status PENDING  Incomplete  Blood Culture (routine x 2)     Status: None (Preliminary result)   Collection Time: 11/02/22 12:16 PM   Specimen: BLOOD  Result Value Ref Range Status   Specimen Description  BLOOD BLOOD LEFT HAND  Final   Special Requests   Final    BOTTLES DRAWN AEROBIC AND ANAEROBIC Blood Culture adequate volume   Culture   Final    NO GROWTH 3 DAYS Performed at [facility], [address], [place], [place] [address]    Report Status PENDING  Incomplete  Resp panel by RT-PCR (RSV, Flu A&B, Covid) Anterior Nasal Swab     Status: None   Collection Time: 11/02/22  1:17 PM   Specimen: Anterior Nasal Swab  Result Value Ref Range Status   SARS Coronavirus 2 by RT PCR NEGATIVE NEGATIVE Final    Comment:  (NOTE) SARS-CoV-2 target nucleic acids are NOT DETECTED.  The SARS-CoV-2 RNA is generally detectable in upper respiratory specimens during the acute phase of infection. The lowest concentration of SARS-CoV-2 viral copies this assay can detect is 138 copies/mL. A negative result does not preclude SARS-Cov-2 infection and should not be used as the sole basis for treatment or other patient management decisions. A negative result may occur with  improper specimen collection/handling, submission of specimen other than nasopharyngeal swab, presence of viral mutation(s) within the areas targeted by this assay, and inadequate number of viral copies(<138 copies/mL). A negative result must be combined with clinical observations, patient history, and epidemiological information. The expected result is Negative.  Fact Sheet for Patients:  BloggerCourse.com  Fact Sheet for Healthcare Providers:  SeriousBroker.it  This test is no t yet approved or cleared by the [place] FDA and  has been authorized for detection and/or diagnosis of SARS-CoV-2 by FDA under an Emergency Use Authorization (EUA). This EUA will remain  in effect (meaning this test can be used) for the duration of the COVID-19 declaration under Section 564(b)(1) of the Act, 21 U.S.C.section 360bbb-3(b)(1), unless the authorization is terminated  or revoked sooner.       Influenza A by PCR NEGATIVE NEGATIVE Final   Influenza B by PCR NEGATIVE NEGATIVE Final    Comment: (NOTE) The Xpert Xpress SARS-CoV-2/FLU/RSV plus assay is intended as an aid in the diagnosis of influenza from Nasopharyngeal swab specimens and should not be used as a sole basis for treatment. Nasal washings and aspirates are unacceptable for Xpert Xpress SARS-CoV-2/FLU/RSV testing.  Fact Sheet for Patients: BloggerCourse.com  Fact Sheet for Healthcare  Providers: SeriousBroker.it  This test is not yet approved or cleared by the [place] FDA and has been authorized for detection and/or diagnosis of SARS-CoV-2 by FDA under an Emergency Use Authorization (EUA). This EUA will remain in effect (meaning this test can be used) for the duration of the COVID-19 declaration under Section 564(b)(1) of the Act, 21 U.S.C. section 360bbb-3(b)(1), unless the authorization is terminated or revoked.     Resp Syncytial Virus by PCR NEGATIVE NEGATIVE Final    Comment: (NOTE) Fact Sheet for Patients: BloggerCourse.com  Fact Sheet for Healthcare Providers: SeriousBroker.it  This test is not yet approved or cleared by the [place] FDA and has been authorized for detection and/or diagnosis of SARS-CoV-2 by FDA under an Emergency Use Authorization (EUA). This EUA will remain in effect (meaning this test can be used) for the duration of the COVID-19 declaration under Section 564(b)(1) of the Act, 21 U.S.C. section 360bbb-3(b)(1), unless the authorization is terminated or revoked.  Performed at [facility], [address], [place], [place] [address]   MRSA Next Gen by PCR, Nasal     Status: None   Collection Time: 11/02/22  3:00 PM   Specimen:  Nasal Mucosa; Nasal Swab  Result Value Ref Range Status   MRSA by PCR Next Gen NOT DETECTED NOT DETECTED Final    Comment: (NOTE) The GeneXpert MRSA Assay (FDA approved for NASAL specimens only), is one component of a comprehensive MRSA colonization surveillance program. It is not intended to diagnose MRSA infection nor to guide or monitor treatment for MRSA infections. Test performance is not FDA approved in patients less than 10 years old. Performed at [facility], [address], [place], [place] [address]     Labs: CBC: Recent Labs  Lab 11/02/22 1117 11/03/22 0523 11/04/22 0423  WBC 26.1* 24.1* 17.3*  NEUTROABS  23.2*  --   --   HGB 13.7 11.2* 11.2*  HCT 43.1 34.0* 32.9*  MCV 107.8* 105.6* 101.9*  PLT 228 254 274   Basic Metabolic Panel: Recent Labs  Lab 11/02/22 1117 11/03/22 0523 11/04/22 0423 11/05/22 0447  NA 133* 135 135 138  K 4.9 3.6 3.1* 2.6*  CL 96* 103 104 104  CO2 21* 22 23 25   GLUCOSE 170* 78 95 85  BUN 28* 23 17 11   CREATININE 2.34* 1.17 0.82 0.71  CALCIUM 8.5* 7.6* 7.9* 7.6*  MG  --  1.2* 1.6* 1.6*  PHOS  --   --  1.9* 3.9   Liver Function Tests: Recent Labs  Lab 11/02/22 1117  AST 52*  ALT 21  ALKPHOS 53  BILITOT 0.8  PROT 6.2*  ALBUMIN 2.5*   CBG: Recent Labs  Lab 11/02/22 0947  GLUCAP 188*    Discharge time spent: greater than 30 minutes.  Signed: Tyrone Nine, MD [facility] 11/05/2022

## 2022-11-06 LAB — CULTURE, BLOOD (ROUTINE X 2): Culture: NO GROWTH

## 2022-11-07 LAB — CULTURE, BLOOD (ROUTINE X 2): Culture: NO GROWTH

## 2022-12-28 DEATH — deceased
